# Patient Record
Sex: Female | Born: 1950
Health system: Southern US, Community
[De-identification: ages and names within clinical notes are randomized; demographics above are authoritative.]

## PROBLEM LIST (undated history)

## (undated) DIAGNOSIS — D219 Benign neoplasm of connective and other soft tissue, unspecified: Secondary | ICD-10-CM

## (undated) DIAGNOSIS — E785 Hyperlipidemia, unspecified: Secondary | ICD-10-CM

## (undated) DIAGNOSIS — E079 Disorder of thyroid, unspecified: Secondary | ICD-10-CM

## (undated) DIAGNOSIS — K219 Gastro-esophageal reflux disease without esophagitis: Secondary | ICD-10-CM

## (undated) DIAGNOSIS — J302 Other seasonal allergic rhinitis: Secondary | ICD-10-CM

## (undated) DIAGNOSIS — K589 Irritable bowel syndrome without diarrhea: Secondary | ICD-10-CM

## (undated) DIAGNOSIS — M810 Age-related osteoporosis without current pathological fracture: Secondary | ICD-10-CM

## (undated) DIAGNOSIS — M8580 Other specified disorders of bone density and structure, unspecified site: Secondary | ICD-10-CM

## (undated) DIAGNOSIS — T7840XA Allergy, unspecified, initial encounter: Secondary | ICD-10-CM

## (undated) HISTORY — DX: Other seasonal allergic rhinitis: J30.2

## (undated) HISTORY — DX: Age-related osteoporosis without current pathological fracture: M81.0

## (undated) HISTORY — DX: Allergy, unspecified, initial encounter: T78.40XA

## (undated) HISTORY — DX: Gastro-esophageal reflux disease without esophagitis: K21.9

## (undated) HISTORY — DX: Irritable bowel syndrome, unspecified: K58.9

## (undated) HISTORY — DX: Disorder of thyroid, unspecified: E07.9

## (undated) HISTORY — PX: TRIGGER FINGER RELEASE: SHX641

## (undated) HISTORY — DX: Hyperlipidemia, unspecified: E78.5

## (undated) HISTORY — DX: Other specified disorders of bone density and structure, unspecified site: M85.80

## (undated) HISTORY — PX: COLONOSCOPY: SHX174

## (undated) HISTORY — PX: ABDOMINAL HYSTERECTOMY: SHX81

## (undated) HISTORY — DX: Benign neoplasm of connective and other soft tissue, unspecified: D21.9

---

## 1998-02-28 ENCOUNTER — Other Ambulatory Visit: Admission: RE | Admit: 1998-02-28 | Discharge: 1998-02-28 | Payer: Self-pay | Admitting: Obstetrics and Gynecology

## 2002-06-11 ENCOUNTER — Other Ambulatory Visit: Admission: RE | Admit: 2002-06-11 | Discharge: 2002-06-11 | Payer: Self-pay | Admitting: Obstetrics and Gynecology

## 2003-06-25 ENCOUNTER — Other Ambulatory Visit: Admission: RE | Admit: 2003-06-25 | Discharge: 2003-06-25 | Payer: Self-pay | Admitting: Obstetrics and Gynecology

## 2004-07-23 ENCOUNTER — Other Ambulatory Visit: Admission: RE | Admit: 2004-07-23 | Discharge: 2004-07-23 | Payer: Self-pay | Admitting: Obstetrics and Gynecology

## 2005-07-19 ENCOUNTER — Ambulatory Visit: Payer: Self-pay | Admitting: Gastroenterology

## 2005-08-04 ENCOUNTER — Other Ambulatory Visit: Admission: RE | Admit: 2005-08-04 | Discharge: 2005-08-04 | Payer: Self-pay | Admitting: Obstetrics and Gynecology

## 2006-08-12 ENCOUNTER — Other Ambulatory Visit: Admission: RE | Admit: 2006-08-12 | Discharge: 2006-08-12 | Payer: Self-pay | Admitting: Obstetrics and Gynecology

## 2006-12-21 ENCOUNTER — Ambulatory Visit: Payer: Self-pay | Admitting: Gastroenterology

## 2007-05-30 ENCOUNTER — Encounter: Admission: RE | Admit: 2007-05-30 | Discharge: 2007-05-30 | Payer: Self-pay | Admitting: Endocrinology

## 2007-06-20 ENCOUNTER — Encounter: Admission: RE | Admit: 2007-06-20 | Discharge: 2007-06-20 | Payer: Self-pay | Admitting: Endocrinology

## 2007-09-05 ENCOUNTER — Other Ambulatory Visit: Admission: RE | Admit: 2007-09-05 | Discharge: 2007-09-05 | Payer: Self-pay | Admitting: Obstetrics and Gynecology

## 2008-04-08 ENCOUNTER — Telehealth: Payer: Self-pay | Admitting: Gastroenterology

## 2008-06-07 ENCOUNTER — Encounter: Payer: Self-pay | Admitting: Gastroenterology

## 2008-06-10 ENCOUNTER — Ambulatory Visit: Payer: Self-pay | Admitting: Gastroenterology

## 2008-06-10 DIAGNOSIS — K219 Gastro-esophageal reflux disease without esophagitis: Secondary | ICD-10-CM | POA: Insufficient documentation

## 2008-09-17 ENCOUNTER — Other Ambulatory Visit: Admission: RE | Admit: 2008-09-17 | Discharge: 2008-09-17 | Payer: Self-pay | Admitting: Obstetrics and Gynecology

## 2008-09-17 ENCOUNTER — Ambulatory Visit: Payer: Self-pay | Admitting: Obstetrics and Gynecology

## 2008-09-17 ENCOUNTER — Encounter: Payer: Self-pay | Admitting: Obstetrics and Gynecology

## 2008-10-14 ENCOUNTER — Ambulatory Visit: Payer: Self-pay | Admitting: Obstetrics and Gynecology

## 2009-03-06 ENCOUNTER — Telehealth: Payer: Self-pay | Admitting: Gastroenterology

## 2009-10-29 ENCOUNTER — Other Ambulatory Visit: Admission: RE | Admit: 2009-10-29 | Discharge: 2009-10-29 | Payer: Self-pay | Admitting: Obstetrics and Gynecology

## 2009-10-29 ENCOUNTER — Ambulatory Visit: Payer: Self-pay | Admitting: Obstetrics and Gynecology

## 2009-11-06 ENCOUNTER — Ambulatory Visit: Payer: Self-pay | Admitting: Obstetrics and Gynecology

## 2010-11-05 ENCOUNTER — Encounter: Payer: Self-pay | Admitting: Obstetrics and Gynecology

## 2010-11-25 ENCOUNTER — Other Ambulatory Visit: Payer: Self-pay | Admitting: Obstetrics and Gynecology

## 2010-11-25 ENCOUNTER — Other Ambulatory Visit (HOSPITAL_COMMUNITY)
Admission: RE | Admit: 2010-11-25 | Discharge: 2010-11-25 | Disposition: A | Discharge status: Home / Self Care | Payer: BC Managed Care – PPO | Source: Ambulatory Visit | Attending: Obstetrics and Gynecology | Admitting: Obstetrics and Gynecology

## 2010-11-25 ENCOUNTER — Encounter (INDEPENDENT_AMBULATORY_CARE_PROVIDER_SITE_OTHER): Payer: BC Managed Care – PPO | Admitting: Obstetrics and Gynecology

## 2010-11-25 DIAGNOSIS — Z833 Family history of diabetes mellitus: Secondary | ICD-10-CM

## 2010-11-25 DIAGNOSIS — Z01419 Encounter for gynecological examination (general) (routine) without abnormal findings: Secondary | ICD-10-CM

## 2010-11-25 DIAGNOSIS — Z124 Encounter for screening for malignant neoplasm of cervix: Secondary | ICD-10-CM | POA: Insufficient documentation

## 2010-12-02 ENCOUNTER — Encounter (INDEPENDENT_AMBULATORY_CARE_PROVIDER_SITE_OTHER): Payer: BC Managed Care – PPO

## 2010-12-02 DIAGNOSIS — M899 Disorder of bone, unspecified: Secondary | ICD-10-CM

## 2010-12-22 NOTE — Assessment & Plan Note (Signed)
[facility]                         GASTROENTEROLOGY OFFICE NOTE   NAME:[name], Patricia Carey                        MRN:          [id number]  DATE:12/21/2006                            DOB:          May 23, 1951    The patient comes in and says she has been having some burning in the  left side of the neck that every now and then radiates to the shoulder  and has been going on for several weeks. There is no rotator cuff  problems. Thought maybe it had to do with reflux. She said she thought  it might be related to GERD. I told her it would be awfully unusual in  my experience to be related to GERD. She had been taking 20 mg AcipHex  daily, Reglan at bedtime and she takes Estradiol. She also has known  irritable bowel syndrome and problems with intestinal gas.   She had an upper endoscopy in 2001 and a colonoscopic examination at the  same time and it was totally normal, I suggested a 5 year followup. The  upper endoscopy revealed only some mild distal esophagitis, nothing else  of significance.   PHYSICAL EXAMINATION:  She weighed 177, blood pressure 120/68, pulse 64  and regular. Neck, heart and extremities are all unremarkable.   RECOMMENDATIONS:  Take AcipHex as described and the Reglan. I refilled  these and I told her that she needed to have a colonoscopic examination  some time in the future. I asked her to followup with one of my partners  as needed for her followup care.     Ulyess Mort, MD  Electronically Signed    SML/MedQ  DD: 12/21/2006  DT: 12/21/2006  Job #: [id number]

## 2011-03-03 ENCOUNTER — Other Ambulatory Visit: Payer: Self-pay | Admitting: Obstetrics and Gynecology

## 2011-03-09 ENCOUNTER — Encounter: Payer: Self-pay | Admitting: Obstetrics and Gynecology

## 2011-03-09 ENCOUNTER — Telehealth: Payer: Self-pay

## 2011-03-09 NOTE — Telephone Encounter (Signed)
How often has she been taking her vitamin D 50,000 IUs?

## 2011-03-09 NOTE — Telephone Encounter (Signed)
PT. STATES RECENT VIT. D LEVEL WITH PCP WAS 27.1. CONTINUE VIT.D 50,000 I.U. RX? IF SO WE NEED TO SEND RX TO [facility].

## 2011-03-10 MED ORDER — VITAMIN D3 1.25 MG (50000 UT) PO CAPS: 1.0000 | ORAL_CAPSULE | ORAL | Status: DC

## 2011-03-10 NOTE — Telephone Encounter (Signed)
PT TAKES VIT. D 50,000IU'S EVERY OTHER WEEK. NO OTC VIT.D. I SET UP MEDS & ORDERS FOR YOU TO ADD DIAGNOSIS,SIGN & RX WILL ROUTE TO [facility] ONCE YOU CLOSE ENCOUNTER.

## 2011-03-11 ENCOUNTER — Telehealth: Payer: Self-pay

## 2011-03-11 MED ORDER — ERGOCALCIFEROL 1.25 MG (50000 UT) PO CAPS: ORAL_CAPSULE | ORAL | Status: DC

## 2011-03-11 NOTE — Telephone Encounter (Signed)
PT NOTIFIED BY WORK VMAIL CORRECT VIT. 50,000 IU'S RX WAS RESENT TO [facility].

## 2011-05-28 ENCOUNTER — Other Ambulatory Visit: Payer: Self-pay | Admitting: Gastroenterology

## 2011-06-28 ENCOUNTER — Other Ambulatory Visit: Payer: Self-pay | Admitting: Gastroenterology

## 2011-06-28 MED ORDER — OMEPRAZOLE-SODIUM BICARBONATE 40-1100 MG PO CAPS: 1.0000 | ORAL_CAPSULE | Freq: Every day | ORAL | Status: DC

## 2011-06-28 NOTE — Telephone Encounter (Signed)
Pt called and rov scheduled zegerid sent to pharmacy to last until rov

## 2011-07-12 ENCOUNTER — Ambulatory Visit: Payer: BC Managed Care – PPO | Admitting: Gastroenterology

## 2011-08-10 HISTORY — PX: COLONOSCOPY: SHX174

## 2011-08-13 ENCOUNTER — Ambulatory Visit (INDEPENDENT_AMBULATORY_CARE_PROVIDER_SITE_OTHER): Payer: BC Managed Care – PPO | Admitting: Gastroenterology

## 2011-08-13 ENCOUNTER — Encounter: Payer: Self-pay | Admitting: Gastroenterology

## 2011-08-13 VITALS — BP 128/74 | HR 64 | Ht 63.0 in | Wt 181.0 lb

## 2011-08-13 DIAGNOSIS — Z1211 Encounter for screening for malignant neoplasm of colon: Secondary | ICD-10-CM

## 2011-08-13 MED ORDER — PEG-KCL-NACL-NASULF-NA ASC-C 100 G PO SOLR: 1.0000 | ORAL | Status: DC

## 2011-08-13 MED ORDER — OMEPRAZOLE-SODIUM BICARBONATE 40-1100 MG PO CAPS: 1.0000 | ORAL_CAPSULE | Freq: Every day | ORAL | Status: DC

## 2011-08-13 NOTE — Patient Instructions (Addendum)
New script for Zegeride called in. You will be set up for a colonoscopy for colon cancer screening, routine risk. A copy of this information will be made available to Dr. Azucena Cecil.   Pt Movi Prep prescription was sent to mail order pharmacy in error was resent to [facility] aid. I spoke with [facility] at [facility] and cancelled the Movi prep with them. Confirmation of the cancellation was given by Erie Noe.

## 2011-08-13 NOTE — Progress Notes (Signed)
Review of pertinent gastrointestinal problems: 1. GERD: she had upper endoscopy Dr. Sam LeBauerJuly 2001 this showed mild acute and chronic reflux esophagitis mild antritis and slight inflammation and pyloric channel mild duodenitis.  PPI has worked very well.   HPI: This is a  very pleasant 61 year old woman whom I last saw about 3-1/2 years ago.  Whom i last saw 3 years.  Takes zegeride, one pill a day, usually before bf.  This helps very well.  Has occasional dyspepsia and will take a tagamet or pepcid or some baking soda.  No dysphagia.  Overall weight is up a bit after the holiday's.    She is at routine risk for colon cancer, her last colonoscopy for colon cancer screening was in 2001.  Review of systems: Pertinent positive and negative review of systems were noted in the above HPI section. Complete review of systems was performed and was otherwise normal.    Past Medical History  Diagnosis Date  . GERD (gastroesophageal reflux disease)   . IBS (irritable bowel syndrome)   . Thyroid disease   . Hyperlipemia     Past Surgical History  Procedure Date  . Trigger finger release     Current Outpatient Prescriptions  Medication Sig Dispense Refill  . AMBULATORY NON FORMULARY MEDICATION Allergy Injection Once a week       . CRESTOR 20 MG tablet Takes one every other day       . ergocalciferol (VITAMIN D2) 50000 UNITS capsule TAKE ONE PILL BY MOUTH EVERY OTHER WEEK   6 capsule  3  . estradiol (ESTRACE) 1 MG tablet Take 1 mg by mouth as directed.       Marland Kitchen levothyroxine (SYNTHROID, LEVOTHROID) 50 MCG tablet Take 50 mcg by mouth daily.        Marland Kitchen omeprazole-sodium bicarbonate (ZEGERID) 40-1100 MG per capsule Take 1 capsule by mouth daily before breakfast.        . Triamcinolone Acetonide (NASACORT AQ NA) Place into the nose as directed.          Allergies as of 08/13/2011 - Review Complete 08/13/2011  Allergen Reaction Noted  . Aspirin  06/10/2008    Family History    Problem Relation Age of Onset  . Colon cancer Neg Hx     History   Social History  . Marital Status: Single    Spouse Name: N/A    Number of Children: N/A  . Years of Education: N/A   Occupational History  . Not on file.   Social History Main Topics  . Smoking status: Never Smoker   . Smokeless tobacco: Never Used  . Alcohol Use: No  . Drug Use: No  . Sexually Active: Not on file   Other Topics Concern  . Not on file   Social History Narrative  . No narrative on file       Physical Exam: BP 128/74  Pulse 64  Ht 5\' 3"  (1.6 m)  Wt 181 lb (82.101 kg)  BMI 32.06 kg/m2 Constitutional: generally well-appearing Psychiatric: alert and oriented x3 Eyes: extraocular movements intact Mouth: oral pharynx moist, no lesions Neck: supple no lymphadenopathy Cardiovascular: heart regular rate and rhythm Lungs: clear to auscultation bilaterally Abdomen: soft, nontender, nondistended, no obvious ascites, no peritoneal signs, normal bowel sounds Extremities: no lower extremity edema bilaterally Skin: no lesions on visible extremities    Assessment and plan: 61 y.o. female with  chronic GERD without alarm symptoms, well-controlled on proton pump inhibitor. Routine risk for  colon cancer  She will continue on proton pump inhibitor once daily, I have called her in a new prescription. She is at routine risk for colon cancer and we will arrange for colonoscopy to be done at her soonest convenience, her last was in 2001.

## 2011-08-18 ENCOUNTER — Ambulatory Visit (AMBULATORY_SURGERY_CENTER): Payer: BC Managed Care – PPO | Admitting: Gastroenterology

## 2011-08-18 ENCOUNTER — Encounter: Payer: Self-pay | Admitting: Gastroenterology

## 2011-08-18 VITALS — HR 71 | Temp 96.7°F | Resp 20 | Ht 63.0 in | Wt 181.0 lb

## 2011-08-18 DIAGNOSIS — Z1211 Encounter for screening for malignant neoplasm of colon: Secondary | ICD-10-CM

## 2011-08-18 MED ORDER — SODIUM CHLORIDE 0.9 % IV SOLN: 500.0000 mL | INTRAVENOUS | Status: DC

## 2011-08-18 NOTE — Op Note (Signed)
[facility] [address] [place], [place]  [address]  COLONOSCOPY PROCEDURE REPORT  PATIENT:  Patricia Carey, Patricia Carey  MR#:  [id number] BIRTHDATE:  [birth year], 60 yrs. old  GENDER:  female ENDOSCOPIST:  Rachael Fee, MD PROCEDURE DATE:  08/18/2011 PROCEDURE:  Colonoscopy 96[address] ASA CLASS:  Class II INDICATIONS:  Routine Risk Screening MEDICATIONS:   Fentanyl 75 mcg IV, These medications were titrated to patient response per physician's verbal order, Versed 8 mg IV  DESCRIPTION OF PROCEDURE:   After the risks benefits and alternatives of the procedure were thoroughly explained, informed consent was obtained.  Digital rectal exam was performed and revealed no rectal masses.   The LB [id number] endoscope was introduced through the anus and advanced to the cecum, which was identified by both the appendix and ileocecal valve, without limitations.  The quality of the prep was good..  The instrument was then slowly withdrawn as the colon was fully examined. <<PROCEDUREIMAGES>>  FINDINGS:  Internal Hemorrhoids were found.  This was otherwise a normal examination of the colon (see image1, image3, and image4). Retroflexed views in the rectum revealed no abnormalities. COMPLICATIONS:  None  ENDOSCOPIC IMPRESSION: 1) Internal hemorrhoids 2) Otherwise normal examination; no polyps or cancers  RECOMMENDATIONS: 1) You should continue to follow colorectal cancer screening guidelines for "routine risk" patients with a repeat colonoscopy in 10 years. There is no need for FOBT (stool) testing for at least 5 years.  REPEAT EXAM:  10 years  ______________________________ Rachael Fee, MD  n. eSIGNED:   Rachael Fee at 08/18/2011 11:00 AM  Cottie Banda, [phone]

## 2011-08-18 NOTE — Patient Instructions (Signed)
Resume all medications. D/C instructions reviewed with family. Information given on hemorrhoids and high fiber diet.

## 2011-08-18 NOTE — Progress Notes (Signed)
Patient did not experience any of the following events: a burn prior to discharge; a fall within the facility; wrong site/side/patient/procedure/implant event; or a hospital transfer or hospital admission upon discharge from the facility. (G8907) Patient did not have preoperative order for IV antibiotic SSI prophylaxis. (G8918)  

## 2011-08-19 ENCOUNTER — Telehealth: Payer: Self-pay | Admitting: *Deleted

## 2011-08-19 NOTE — Telephone Encounter (Signed)

## 2011-11-30 ENCOUNTER — Other Ambulatory Visit: Payer: Self-pay | Admitting: Obstetrics and Gynecology

## 2012-02-17 ENCOUNTER — Telehealth: Payer: Self-pay | Admitting: Obstetrics and Gynecology

## 2012-02-17 ENCOUNTER — Other Ambulatory Visit: Payer: Self-pay | Admitting: Obstetrics and Gynecology

## 2012-02-17 NOTE — Telephone Encounter (Signed)
Left message for patient that I refilled her Rx one time but she is overdue for CE and needs to call and schedule RGCE.

## 2012-02-28 ENCOUNTER — Other Ambulatory Visit: Payer: Self-pay | Admitting: Obstetrics and Gynecology

## 2012-02-28 NOTE — Telephone Encounter (Signed)
Has RGCE scheduled 03/21/12.

## 2012-03-06 ENCOUNTER — Encounter: Payer: Self-pay | Admitting: Obstetrics and Gynecology

## 2012-03-10 ENCOUNTER — Encounter: Payer: Self-pay | Admitting: Gynecology

## 2012-03-10 DIAGNOSIS — T7840XA Allergy, unspecified, initial encounter: Secondary | ICD-10-CM | POA: Insufficient documentation

## 2012-03-10 DIAGNOSIS — K589 Irritable bowel syndrome without diarrhea: Secondary | ICD-10-CM | POA: Insufficient documentation

## 2012-03-10 DIAGNOSIS — E079 Disorder of thyroid, unspecified: Secondary | ICD-10-CM | POA: Insufficient documentation

## 2012-03-10 DIAGNOSIS — M8580 Other specified disorders of bone density and structure, unspecified site: Secondary | ICD-10-CM | POA: Insufficient documentation

## 2012-03-10 DIAGNOSIS — D219 Benign neoplasm of connective and other soft tissue, unspecified: Secondary | ICD-10-CM | POA: Insufficient documentation

## 2012-03-10 DIAGNOSIS — K219 Gastro-esophageal reflux disease without esophagitis: Secondary | ICD-10-CM | POA: Insufficient documentation

## 2012-03-10 DIAGNOSIS — E785 Hyperlipidemia, unspecified: Secondary | ICD-10-CM | POA: Insufficient documentation

## 2012-03-21 ENCOUNTER — Encounter: Payer: BC Managed Care – PPO | Admitting: Obstetrics and Gynecology

## 2012-04-03 ENCOUNTER — Encounter: Payer: Self-pay | Admitting: Obstetrics and Gynecology

## 2012-04-03 ENCOUNTER — Ambulatory Visit (INDEPENDENT_AMBULATORY_CARE_PROVIDER_SITE_OTHER): Payer: BC Managed Care – PPO | Admitting: Obstetrics and Gynecology

## 2012-04-03 VITALS — BP 126/74 | Ht 64.0 in | Wt 186.0 lb

## 2012-04-03 DIAGNOSIS — Z01419 Encounter for gynecological examination (general) (routine) without abnormal findings: Secondary | ICD-10-CM

## 2012-04-03 MED ORDER — ESTRADIOL 0.5 MG PO TABS: 0.5000 mg | ORAL_TABLET | Freq: Every day | ORAL | Status: DC

## 2012-04-03 NOTE — Patient Instructions (Signed)
Continue yearly mammograms. Bone density in 2014. 

## 2012-04-03 NOTE — Progress Notes (Signed)
The patient came to see me today for her annual GYN exam. We have reduced her estradiol from 1 mg to half a milligram. She has done well on  a reduced dose. She is not having hot flashes. She is currently not sexually active. She is up-to-date on mammograms. Her last bone density was April, 2012. She has stable osteopenia without an elevated FRAX risk. She has had no fractures. She is having no vaginal bleeding. She is having no pelvic pain. She had a TAH in 1988 for fibroids and adenomyosis. She has always had normal Pap smears. Her last Pap smear was 2012. Her PCP does her lab work and treats her for hypothyroidism.  HEENT: Within normal limits.Kennon Portela present. Neck: No masses. Supraclavicular lymph nodes: Not enlarged. Breasts: Examined in both sitting and lying position. Symmetrical without skin changes or masses. Abdomen: Soft no masses guarding or rebound. No hernias. Pelvic: External within normal limits. BUS within normal limits. Vaginal examination shows poor  estrogen effect, no cystocele enterocele or rectocele. Cervix and uterus absent. Adnexa within normal limits. Rectovaginal confirmatory. Extremities within normal limits.  Assessment: #1. Menopausal symptoms #2. Osteopenia  Plan: Continue estradiol half a milligram daily. Continue bone densities. Continue yearly mammograms. Discussed either stopping estradiol or taking it less frequently. She will decide.The new Pap smear guidelines were discussed with the patient. No pap done.

## 2012-04-04 LAB — URINALYSIS W MICROSCOPIC + REFLEX CULTURE
Bacteria, UA: NONE SEEN
Casts: NONE SEEN
Crystals: NONE SEEN
Glucose, UA: NEGATIVE mg/dL
Leukocytes, UA: NEGATIVE
Protein, ur: NEGATIVE mg/dL
Specific Gravity, Urine: 1.018 (ref 1.005–1.030)
Urobilinogen, UA: 1 mg/dL (ref 0.0–1.0)
pH: 6 (ref 5.0–8.0)

## 2012-04-07 ENCOUNTER — Encounter: Payer: Self-pay | Admitting: Obstetrics and Gynecology

## 2012-05-11 ENCOUNTER — Other Ambulatory Visit: Payer: Self-pay | Admitting: Obstetrics and Gynecology

## 2012-08-25 ENCOUNTER — Other Ambulatory Visit: Payer: Self-pay | Admitting: Gastroenterology

## 2012-09-29 ENCOUNTER — Telehealth: Payer: Self-pay | Admitting: Gastroenterology

## 2012-09-29 MED ORDER — OMEPRAZOLE-SODIUM BICARBONATE 40-1100 MG PO CAPS: 1.0000 | ORAL_CAPSULE | Freq: Every day | ORAL | Status: DC

## 2012-09-29 NOTE — Telephone Encounter (Signed)
Refill sent per pt request.  

## 2012-10-13 ENCOUNTER — Ambulatory Visit: Payer: BC Managed Care – PPO | Admitting: Gastroenterology

## 2012-10-27 ENCOUNTER — Ambulatory Visit (INDEPENDENT_AMBULATORY_CARE_PROVIDER_SITE_OTHER): Payer: BC Managed Care – PPO | Admitting: Gastroenterology

## 2012-10-27 ENCOUNTER — Encounter: Payer: Self-pay | Admitting: Gastroenterology

## 2012-10-27 VITALS — BP 122/80 | HR 80 | Ht 63.25 in | Wt 185.0 lb

## 2012-10-27 DIAGNOSIS — K219 Gastro-esophageal reflux disease without esophagitis: Secondary | ICD-10-CM

## 2012-10-27 NOTE — Progress Notes (Signed)
Review of pertinent gastrointestinal problems:  1. GERD: she had upper endoscopy Dr. Sam LeBauerJuly 2001 this showed mild acute and chronic reflux esophagitis mild antritis and slight inflammation and pyloric channel mild duodenitis. PPI has worked very well. 2. Routine risk for colon cancer; colonoscopy Christella Hartigan 08/2011 found no polyp, + hemorrhoids. Recall exam at 10 years for screening.  HPI: This is a  very pleasant 62 year old woman that I saw about a year ago.   zegeride works well.  She tried cutting back even just a few hours and then has indigestion, pyrosis.    She has no dypshagia, no weight loss, no overt gi bleeding.  wieght up a bit.  Will take an h2 blocker at bedtime.   Past Medical History  Diagnosis Date  . GERD (gastroesophageal reflux disease)   . IBS (irritable bowel syndrome)   . Hyperlipemia   . Allergy   . Fibroid   . Thyroid disease   . Osteopenia     Past Surgical History  Procedure Laterality Date  . Trigger finger release    . Colonoscopy      2001  . Abdominal hysterectomy      TAH    Current Outpatient Prescriptions  Medication Sig Dispense Refill  . AMBULATORY NON FORMULARY MEDICATION Allergy Injection Once a week       . CRESTOR 20 MG tablet Takes one every other day       . estradiol (ESTRACE) 0.5 MG tablet Take 1 tablet (0.5 mg total) by mouth daily.  90 tablet  3  . levothyroxine (SYNTHROID, LEVOTHROID) 50 MCG tablet Take 50 mcg by mouth daily.        Marland Kitchen omeprazole-sodium bicarbonate (ZEGERID) 40-1100 MG per capsule Take 1 capsule by mouth daily before breakfast.  90 capsule  3  . PATADAY 0.2 % SOLN       . Triamcinolone Acetonide (NASACORT AQ NA) Place into the nose as directed.        . Vitamin D, Ergocalciferol, (DRISDOL) 50000 UNITS CAPS Take 1 capsule (50,000 Units total) by mouth every 14 (fourteen) days.  6 capsule  4   No current facility-administered medications for this visit.    Allergies as of 10/27/2012 - Review Complete  10/27/2012  Allergen Reaction Noted  . Aspirin  06/10/2008    Family History  Problem Relation Age of Onset  . Colon cancer Neg Hx   . Throat cancer Sister 49    DECEASED  . Cancer Sister     Throat cancer  . Heart disease Mother   . Breast cancer Sister     Age 50's  . Heart disease Sister     History   Social History  . Marital Status: Single    Spouse Name: N/A    Number of Children: N/A  . Years of Education: N/A   Occupational History  . Not on file.   Social History Main Topics  . Smoking status: Never Smoker   . Smokeless tobacco: Never Used  . Alcohol Use: Yes     Comment: Rare  . Drug Use: No  . Sexually Active: No   Other Topics Concern  . Not on file   Social History Narrative  . No narrative on file      Physical Exam: BP 122/80  Pulse 80  Ht 5' 3.25" (1.607 m)  Wt 185 lb (83.915 kg)  BMI 32.49 kg/m2 Constitutional: generally well-appearing Psychiatric: alert and oriented x3 Abdomen: soft, nontender, nondistended, no obvious  ascites, no peritoneal signs, normal bowel sounds     Assessment and plan: 62 y.o. female with chronic GERD, no alarm symptoms  She will continue taking proton pump inhibitor once daily and H2 blocker on an as-needed basis. She will try Gas-X for some mild bloating which she feels with certain foods. She does not need to return for medication refill for her proton pump inhibitor for 2 years. She knows to call sooner she has other issues.

## 2012-10-27 NOTE — Patient Instructions (Addendum)
Continue zegeride once daily.  Tagamet as needed. No need for return appointment if all is well for 2 years. Try one gas ex pill with any bloating meal.

## 2013-04-07 ENCOUNTER — Other Ambulatory Visit: Payer: Self-pay | Admitting: Obstetrics and Gynecology

## 2013-04-10 NOTE — Telephone Encounter (Signed)
Pt will be called to schedule her annual exam.  

## 2013-05-22 ENCOUNTER — Other Ambulatory Visit: Payer: Self-pay | Admitting: Obstetrics and Gynecology

## 2013-07-06 ENCOUNTER — Other Ambulatory Visit: Payer: Self-pay | Admitting: Gynecology

## 2013-09-04 ENCOUNTER — Other Ambulatory Visit: Payer: Self-pay | Admitting: Gastroenterology

## 2014-03-21 ENCOUNTER — Encounter: Payer: Self-pay | Admitting: Gastroenterology

## 2014-05-09 ENCOUNTER — Other Ambulatory Visit: Payer: Self-pay

## 2014-05-09 ENCOUNTER — Other Ambulatory Visit: Payer: Self-pay | Admitting: Gastroenterology

## 2014-05-09 MED ORDER — OMEPRAZOLE-SODIUM BICARBONATE 40-1100 MG PO CAPS: 1.0000 | ORAL_CAPSULE | Freq: Every day | ORAL | Status: DC

## 2014-05-10 ENCOUNTER — Other Ambulatory Visit: Payer: Self-pay

## 2014-05-10 MED ORDER — OMEPRAZOLE-SODIUM BICARBONATE 40-1100 MG PO CAPS: 1.0000 | ORAL_CAPSULE | Freq: Every day | ORAL | Status: DC

## 2014-05-16 ENCOUNTER — Telehealth: Payer: Self-pay | Admitting: Gastroenterology

## 2015-06-07 ENCOUNTER — Other Ambulatory Visit: Payer: Self-pay | Admitting: Gastroenterology

## 2015-06-24 ENCOUNTER — Other Ambulatory Visit: Payer: Self-pay

## 2015-06-30 ENCOUNTER — Other Ambulatory Visit: Payer: Self-pay

## 2015-06-30 ENCOUNTER — Other Ambulatory Visit: Payer: Self-pay | Admitting: Gastroenterology

## 2015-06-30 MED ORDER — OMEPRAZOLE-SODIUM BICARBONATE 40-1100 MG PO CAPS: 1.0000 | ORAL_CAPSULE | Freq: Every day | ORAL | Status: DC

## 2015-06-30 NOTE — Telephone Encounter (Signed)
Script sent to pharmacy.

## 2015-08-18 ENCOUNTER — Ambulatory Visit: Payer: Self-pay | Admitting: Gastroenterology

## 2015-09-26 ENCOUNTER — Other Ambulatory Visit: Payer: Self-pay

## 2015-09-30 ENCOUNTER — Telehealth: Payer: Self-pay | Admitting: Gastroenterology

## 2015-09-30 NOTE — Telephone Encounter (Signed)
Pt has been notified that she must keep the appt as scheduled for further refills

## 2015-09-30 NOTE — Telephone Encounter (Signed)
Per pt she ran out of her omeprazole-sodium bicarbonate (ZEGERID) 40-1100 MG capsule a lot sooner. She says her pcp recommended she take it twice a day, because her reflux had increased. She says it has helped. Pt has questions and would like to speak to someone.

## 2015-10-13 ENCOUNTER — Ambulatory Visit (INDEPENDENT_AMBULATORY_CARE_PROVIDER_SITE_OTHER): Payer: Self-pay | Admitting: Gastroenterology

## 2015-10-13 ENCOUNTER — Encounter: Payer: Self-pay | Admitting: Gastroenterology

## 2015-10-13 VITALS — BP 110/70 | HR 72 | Ht 63.0 in | Wt 174.0 lb

## 2015-10-13 DIAGNOSIS — K219 Gastro-esophageal reflux disease without esophagitis: Secondary | ICD-10-CM | POA: Diagnosis not present

## 2015-10-13 NOTE — Progress Notes (Signed)
Review of pertinent gastrointestinal problems:  1. GERD: she had upper endoscopy Dr. Kathrynn Ducking 2001 this showed mild acute and chronic reflux esophagitis mild antritis and slight inflammation and pyloric channel mild duodenitis. PPI has worked very well. 2. Routine risk for colon cancer; colonoscopy Ardis Hughs 08/2011 found no polyp, + hemorrhoids. Recall exam at 10 years for screening.   HPI: This is a   Very pleasant 65 year old woman whom I last saw about 3 years ago  Chief complaint is chronic GERD  Recently went to allergy testing, due to sneezing in AM.  The MD said GERD may be causing her AM sneezing.    She changed her omeprazole to twice daily dosing. Noticed an improvement.  AM omeprazole 40 in AM, tagamet in HS  No dysphagia.  OVerall stable weight   Past Medical History  Diagnosis Date  . GERD (gastroesophageal reflux disease)   . IBS (irritable bowel syndrome)   . Hyperlipemia   . Allergy   . Fibroid   . Thyroid disease   . Osteopenia     Past Surgical History  Procedure Laterality Date  . Trigger finger release    . Colonoscopy      2001  . Abdominal hysterectomy      TAH    Current Outpatient Prescriptions  Medication Sig Dispense Refill  . AMBULATORY NON FORMULARY MEDICATION Allergy Injection Once a week     . CRESTOR 20 MG tablet Takes one every other day     . levothyroxine (SYNTHROID, LEVOTHROID) 50 MCG tablet Take 50 mcg by mouth daily.      Marland Kitchen omeprazole-sodium bicarbonate (ZEGERID) 40-1100 MG capsule Take 1 capsule by mouth daily before breakfast. 90 capsule 1  . PATADAY 0.2 % SOLN     . Triamcinolone Acetonide (NASACORT AQ NA) Place into the nose as directed.      . Vitamin D, Ergocalciferol, (DRISDOL) 50000 UNITS CAPS Take 1 capsule (50,000 Units total) by mouth every 14 (fourteen) days. 6 capsule 4   No current facility-administered medications for this visit.    Allergies as of 10/13/2015 - Review Complete 10/13/2015  Allergen  Reaction Noted  . Aspirin  06/10/2008    Family History  Problem Relation Age of Onset  . Colon cancer Neg Hx   . Throat cancer Sister 61    DECEASED  . Cancer Sister     Throat cancer  . Heart disease Mother   . Breast cancer Sister     Age 64's  . Heart disease Sister     Social History   Social History  . Marital Status: Single    Spouse Name: N/A  . Number of Children: N/A  . Years of Education: N/A   Occupational History  . Not on file.   Social History Main Topics  . Smoking status: Never Smoker   . Smokeless tobacco: Never Used  . Alcohol Use: Yes     Comment: Rare  . Drug Use: No  . Sexual Activity: No   Other Topics Concern  . Not on file   Social History Narrative     Physical Exam: BP 110/70 mmHg  Pulse 72  Ht 5\' 3"  (1.6 m)  Wt 174 lb (78.926 kg)  BMI 30.83 kg/m2 Constitutional: generally well-appearing Psychiatric: alert and oriented x3 Abdomen: soft, nontender, nondistended, no obvious ascites, no peritoneal signs, normal bowel sounds   Assessment and plan: 65 y.o. female with chronic GERD without alarm symptoms, well controlled symptoms on PPI, H2 blocker  She will continue morning proton pump inhibitor and bedtime H2 blocker. Her GERD symptoms are well controlled on this regimen. She has no alarm symptoms at all. I'm happy to refill her prescription antiacid medicines now and again in one year from now. If she still requires prescription strength antiacid medicines in 2 years I would like to see her again in the office   Owens Loffler, MD [facility] 10/13/2015, 3:47 PM

## 2015-10-13 NOTE — Patient Instructions (Signed)
Omeprazole 40mg  every AM, will refill in 2 months when needed and again in 1 year. Take ranitidine 150mg  pill, bedtime every night. Return in 2 years if you still need prescription strength medicines.

## 2015-12-15 ENCOUNTER — Other Ambulatory Visit: Payer: Self-pay | Admitting: Gastroenterology

## 2015-12-31 ENCOUNTER — Other Ambulatory Visit: Payer: Self-pay

## 2015-12-31 ENCOUNTER — Telehealth: Payer: Self-pay | Admitting: Gastroenterology

## 2015-12-31 MED ORDER — OMEPRAZOLE-SODIUM BICARBONATE 40-1100 MG PO CAPS: ORAL_CAPSULE | ORAL | Status: DC

## 2015-12-31 NOTE — Telephone Encounter (Signed)
Pt rx has been sent to the pharmacy as requested, pt notified

## 2015-12-31 NOTE — Telephone Encounter (Signed)
Prescription sent and pt aware she can pick up one month at the local pharmacy

## 2016-03-17 ENCOUNTER — Other Ambulatory Visit: Payer: Self-pay | Admitting: Gastroenterology

## 2017-03-09 ENCOUNTER — Other Ambulatory Visit: Payer: Self-pay | Admitting: Gastroenterology

## 2017-09-09 DIAGNOSIS — Z1231 Encounter for screening mammogram for malignant neoplasm of breast: Secondary | ICD-10-CM | POA: Diagnosis not present

## 2017-09-09 DIAGNOSIS — Z803 Family history of malignant neoplasm of breast: Secondary | ICD-10-CM | POA: Diagnosis not present

## 2017-10-27 DIAGNOSIS — Z23 Encounter for immunization: Secondary | ICD-10-CM | POA: Diagnosis not present

## 2017-10-27 DIAGNOSIS — R232 Flushing: Secondary | ICD-10-CM | POA: Diagnosis not present

## 2017-10-27 DIAGNOSIS — Z0000 Encounter for general adult medical examination without abnormal findings: Secondary | ICD-10-CM | POA: Diagnosis not present

## 2017-10-27 DIAGNOSIS — E039 Hypothyroidism, unspecified: Secondary | ICD-10-CM | POA: Diagnosis not present

## 2017-10-27 DIAGNOSIS — R7303 Prediabetes: Secondary | ICD-10-CM | POA: Diagnosis not present

## 2017-10-27 DIAGNOSIS — E559 Vitamin D deficiency, unspecified: Secondary | ICD-10-CM | POA: Diagnosis not present

## 2017-10-27 DIAGNOSIS — E782 Mixed hyperlipidemia: Secondary | ICD-10-CM | POA: Diagnosis not present

## 2018-02-21 DIAGNOSIS — M65312 Trigger thumb, left thumb: Secondary | ICD-10-CM | POA: Diagnosis not present

## 2018-02-21 DIAGNOSIS — M65342 Trigger finger, left ring finger: Secondary | ICD-10-CM | POA: Diagnosis not present

## 2018-02-21 DIAGNOSIS — M1812 Unilateral primary osteoarthritis of first carpometacarpal joint, left hand: Secondary | ICD-10-CM | POA: Diagnosis not present

## 2018-02-21 DIAGNOSIS — G5601 Carpal tunnel syndrome, right upper limb: Secondary | ICD-10-CM | POA: Diagnosis not present

## 2018-03-04 ENCOUNTER — Other Ambulatory Visit: Payer: Self-pay | Admitting: Gastroenterology

## 2018-03-07 ENCOUNTER — Other Ambulatory Visit: Payer: Self-pay | Admitting: Gastroenterology

## 2018-03-10 DIAGNOSIS — M1812 Unilateral primary osteoarthritis of first carpometacarpal joint, left hand: Secondary | ICD-10-CM | POA: Diagnosis not present

## 2018-03-10 DIAGNOSIS — M65312 Trigger thumb, left thumb: Secondary | ICD-10-CM | POA: Diagnosis not present

## 2018-03-20 ENCOUNTER — Telehealth: Payer: Self-pay | Admitting: Gastroenterology

## 2018-03-20 ENCOUNTER — Other Ambulatory Visit: Payer: Self-pay | Admitting: Gastroenterology

## 2018-03-20 MED ORDER — OMEPRAZOLE-SODIUM BICARBONATE 40-1100 MG PO CAPS: 1.0000 | ORAL_CAPSULE | Freq: Every day | ORAL | 2 refills | Status: DC

## 2018-03-20 NOTE — Telephone Encounter (Signed)
Pt just made an appt with Dr. Ardis Hughs for 05/16/18. She is out of Omeprazole and is requesting if she can have a rf until she sees him. She uses [facility] on [address]. She said that her insurance will only cover it if it is medically neccessary.

## 2018-03-20 NOTE — Telephone Encounter (Signed)
Sent enough refills of omeprazole to [facility] on [address] until office visit 10/19

## 2018-03-21 NOTE — Telephone Encounter (Signed)
Pt called stating that [facility] told her that her insurance will not cover omeprazole unless is medically necessary. However, she said that if prescription is sent to [facility] mail order for 90 day supply there should be no problem and it just needs a PA. PA phone is [phone].

## 2018-03-22 NOTE — Telephone Encounter (Signed)
I spoke with a PA rep through [facility]. PA was done for Zegerid and a response will be faxed to our office within 72 hours. Patient notified

## 2018-03-31 NOTE — Telephone Encounter (Signed)
Patient states she needs the generic for medication zegerid called into [facility] at [phone]. Patient states it will be covered if ordered in a 90 day supply.

## 2018-03-31 NOTE — Telephone Encounter (Signed)
This medication has been denied through [facility] and a letter was sent to patient. She can pay out of pocket or take otc Prilosec. She was asked to call the office for further questions

## 2018-04-25 DIAGNOSIS — K219 Gastro-esophageal reflux disease without esophagitis: Secondary | ICD-10-CM | POA: Diagnosis not present

## 2018-04-25 DIAGNOSIS — R7303 Prediabetes: Secondary | ICD-10-CM | POA: Diagnosis not present

## 2018-04-25 DIAGNOSIS — E782 Mixed hyperlipidemia: Secondary | ICD-10-CM | POA: Diagnosis not present

## 2018-04-25 DIAGNOSIS — E039 Hypothyroidism, unspecified: Secondary | ICD-10-CM | POA: Diagnosis not present

## 2018-05-16 ENCOUNTER — Encounter: Payer: Self-pay | Admitting: Gastroenterology

## 2018-05-16 ENCOUNTER — Ambulatory Visit: Payer: BLUE CROSS/BLUE SHIELD | Admitting: Gastroenterology

## 2018-05-16 VITALS — BP 134/68 | HR 64 | Ht 63.0 in | Wt 172.8 lb

## 2018-05-16 DIAGNOSIS — K219 Gastro-esophageal reflux disease without esophagitis: Secondary | ICD-10-CM

## 2018-05-16 MED ORDER — DEXLANSOPRAZOLE 60 MG PO CPDR
60.0000 mg | DELAYED_RELEASE_CAPSULE | Freq: Every day | ORAL | 3 refills | Status: DC
Start: 2018-05-16 — End: 2018-05-16

## 2018-05-16 MED ORDER — DEXLANSOPRAZOLE 60 MG PO CPDR: 60.0000 mg | DELAYED_RELEASE_CAPSULE | Freq: Every day | ORAL | 3 refills | Status: DC

## 2018-05-16 NOTE — Progress Notes (Signed)
Review of pertinent gastrointestinal problems:  1. GERD: she had upper endoscopy Dr. Kathrynn Ducking 2001 this showed mild acute and chronic reflux esophagitis mild antritis and slight inflammation and pyloric channel mild duodenitis. PPI has worked very well. 2. Routine risk for colon cancer; colonoscopy Ardis Hughs 08/2011 found no polyp, + hemorrhoids. Recall exam at 10 years for screening.   HPI: This is a very pleasant 67 yo woman with chronic GERD  Insurance wont pay for zegeride.  Omeprazole does not help. protonix does not help.  dexilant helps quite well, she got samples from her PCP.  60mg  pills.  I last saw her about 2-1/2 years ago.  At that time her GERD symptoms were under good control.  No alarm symptoms.  She was taking omeprazole 40 mg every morning and ranitidine at 150 mg at bedtime  She has no alarm symptoms (No dysphagia, no unexplained weight loss, no overt gi bleeding)   Chief complaint is chronic GERD  ROS: complete GI ROS as described in HPI, all other review negative.  Constitutional:  No unintentional weight loss   Past Medical History:  Diagnosis Date  . Allergy   . Fibroid   . GERD (gastroesophageal reflux disease)   . Hyperlipemia   . IBS (irritable bowel syndrome)   . Osteopenia   . Thyroid disease     Past Surgical History:  Procedure Laterality Date  . ABDOMINAL HYSTERECTOMY     TAH  . COLONOSCOPY     2001  . TRIGGER FINGER RELEASE      Current Outpatient Medications  Medication Sig Dispense Refill  . AMBULATORY NON FORMULARY MEDICATION Allergy Injection Once a week     . CRESTOR 20 MG tablet Takes one every other day     . levothyroxine (SYNTHROID, LEVOTHROID) 50 MCG tablet Take 50 mcg by mouth daily.      Marland Kitchen omeprazole-sodium bicarbonate (ZEGERID) 40-1100 MG capsule Take 1 capsule by mouth daily before breakfast. 30 capsule 2  . PATADAY 0.2 % SOLN     . Triamcinolone Acetonide (NASACORT AQ NA) Place into the nose as directed.      .  Vitamin D, Ergocalciferol, (DRISDOL) 50000 UNITS CAPS Take 1 capsule (50,000 Units total) by mouth every 14 (fourteen) days. 6 capsule 4   No current facility-administered medications for this visit.     Allergies as of 05/16/2018 - Review Complete 05/16/2018  Allergen Reaction Noted  . Aspirin  06/10/2008    Family History  Problem Relation Age of Onset  . Colon cancer Neg Hx   . Throat cancer Sister 49       DECEASED  . Cancer Sister        Throat cancer  . Heart disease Mother   . Breast cancer Sister        Age 63's  . Heart disease Sister     Social History   Socioeconomic History  . Marital status: Single    Spouse name: Not on file  . Number of children: Not on file  . Years of education: Not on file  . Highest education level: Not on file  Occupational History  . Not on file  Social Needs  . Financial resource strain: Not on file  . Food insecurity:    Worry: Not on file    Inability: Not on file  . Transportation needs:    Medical: Not on file    Non-medical: Not on file  Tobacco Use  . Smoking status: Never Smoker  .  Smokeless tobacco: Never Used  Substance and Sexual Activity  . Alcohol use: Yes    Comment: Rare  . Drug use: No  . Sexual activity: Never    Birth control/protection: Surgical  Lifestyle  . Physical activity:    Days per week: Not on file    Minutes per session: Not on file  . Stress: Not on file  Relationships  . Social connections:    Talks on phone: Not on file    Gets together: Not on file    Attends religious service: Not on file    Active member of club or organization: Not on file    Attends meetings of clubs or organizations: Not on file    Relationship status: Not on file  . Intimate partner violence:    Fear of current or ex partner: Not on file    Emotionally abused: Not on file    Physically abused: Not on file    Forced sexual activity: Not on file  Other Topics Concern  . Not on file  Social History Narrative   . Not on file     Physical Exam: BP 134/68   Pulse 64   Ht 5\' 3"  (1.6 m)   Wt 172 lb 12.8 oz (78.4 kg)   BMI 30.61 kg/m  Constitutional: generally well-appearing Psychiatric: alert and oriented x3 Abdomen: soft, nontender, nondistended, no obvious ascites, no peritoneal signs, normal bowel sounds No peripheral edema noted in lower extremities  Assessment and plan: 67 y.o. female with chronic GERD without alarm symptoms.  Dexilant seem to help quite well.  Hopefully wont' be too expensive with her insurance plan.  She will continue nightly famotidine 20mg  pills.  ROV in 2 years if she still needs prescription ppi, otherwise i'm happy to refill in 1 year.   Please see the "Patient Instructions" section for addition details about the plan.  Owens Loffler, MD [facility] 05/16/2018, 3:43 PM

## 2018-05-16 NOTE — Patient Instructions (Addendum)
New prescription for dexilant; 60mg  pills, one pill before breakfast every morning.  90 pills with 3 refills. Pepcid (famotidine) 20mg  pills is equilivant to zantac (ranitidine) 150mg  pills. Try gas-ex pills with bloating, gassiness. Consider food diary.  Thank you for entrusting me with your care and choosing [facility].  Dr Ardis Hughs

## 2018-05-19 ENCOUNTER — Telehealth: Payer: Self-pay | Admitting: Gastroenterology

## 2018-05-19 NOTE — Telephone Encounter (Signed)
The pt was advised that the pharmacy is the best place to ask that question.

## 2018-09-15 DIAGNOSIS — Z803 Family history of malignant neoplasm of breast: Secondary | ICD-10-CM | POA: Diagnosis not present

## 2018-09-15 DIAGNOSIS — Z1231 Encounter for screening mammogram for malignant neoplasm of breast: Secondary | ICD-10-CM | POA: Diagnosis not present

## 2018-11-02 DIAGNOSIS — R7303 Prediabetes: Secondary | ICD-10-CM | POA: Diagnosis not present

## 2018-11-02 DIAGNOSIS — K219 Gastro-esophageal reflux disease without esophagitis: Secondary | ICD-10-CM | POA: Diagnosis not present

## 2018-11-02 DIAGNOSIS — E039 Hypothyroidism, unspecified: Secondary | ICD-10-CM | POA: Diagnosis not present

## 2018-11-02 DIAGNOSIS — Z1211 Encounter for screening for malignant neoplasm of colon: Secondary | ICD-10-CM | POA: Diagnosis not present

## 2018-11-02 DIAGNOSIS — Z23 Encounter for immunization: Secondary | ICD-10-CM | POA: Diagnosis not present

## 2018-11-02 DIAGNOSIS — Z0000 Encounter for general adult medical examination without abnormal findings: Secondary | ICD-10-CM | POA: Diagnosis not present

## 2018-11-02 DIAGNOSIS — E782 Mixed hyperlipidemia: Secondary | ICD-10-CM | POA: Diagnosis not present

## 2018-11-02 DIAGNOSIS — E559 Vitamin D deficiency, unspecified: Secondary | ICD-10-CM | POA: Diagnosis not present

## 2018-12-11 ENCOUNTER — Other Ambulatory Visit: Payer: Self-pay

## 2018-12-11 ENCOUNTER — Ambulatory Visit (INDEPENDENT_AMBULATORY_CARE_PROVIDER_SITE_OTHER): Payer: BLUE CROSS/BLUE SHIELD | Admitting: Gastroenterology

## 2018-12-11 VITALS — Ht 63.0 in | Wt 172.0 lb

## 2018-12-11 DIAGNOSIS — K219 Gastro-esophageal reflux disease without esophagitis: Secondary | ICD-10-CM

## 2018-12-11 MED ORDER — OMEPRAZOLE 40 MG PO CPDR: 40.0000 mg | DELAYED_RELEASE_CAPSULE | Freq: Every day | ORAL | 3 refills | Status: DC

## 2018-12-11 MED ORDER — FAMOTIDINE 20 MG PO TABS: 20.0000 mg | ORAL_TABLET | Freq: Every day | ORAL | 3 refills | Status: AC

## 2018-12-11 NOTE — Patient Instructions (Addendum)
New script for omeprazole 40mg  pills to take one pill every morning 20-30 min before breakfast meal.  Disp 90 with 3 refills.  Famotidine 20mg  pills one pill at bedtime every night at bedtime (new script, 20mg  pills, 90 days, 3 refills).  She will call

## 2018-12-11 NOTE — Progress Notes (Signed)
Review of pertinent gastrointestinal problems:  1. GERD: she had upper endoscopy Dr. Kathrynn Ducking 2001 this showed mild acute and chronic reflux esophagitis mild antritis and slight inflammation and pyloric channel mild duodenitis. PPI has worked very well. 2. Routine risk for colon cancer;colonoscopy Ardis Hughs 08/2011 found no polyp, + hemorrhoids. Recall exam at 10 years for screening.  This service was provided via virtual visit. Only audio was used.  The patient was located at home.  I was located in my office.  The patient did consent to this virtual visit and is aware of possible charges through their insurance for this visit.  The patient is an established patient.  My certified medical assistant, Grace Bushy, contributed to this visit by contacting the patient by phone 1 or 2 business days prior to the appointment and also followed up on the recommendations I made after the visit.  Time spent on virtual visit: 23 min   HPI: This is a very pleasant 68 year old woman  I last saw her in the office in person October 2019.  At that time insurance would not pay for Zegeride.  Omeprazole did not seem to help.  Protonix did not seem to help.  Dexilant was the only thing that really helped her.  She had no alarm symptoms.  She was doing well on dexilant for a long time, then started having terrible stomach aches and she felt it was all due to the dexilant.  Was under a lot of stress (covid fears, work changes).  She stopped the dexilant completely and instead was taking pepcid OTC.  She asked for prescription for the pepcid from her PCP.  She takes a pepcid in the morning and does well until after dinner.  Then has reflux after dinner.   She's been taking famotidine 20mg  pills in AM before she eats.  She now tells me that omeprazole   No alarm symptoms (no overt gi bleeding, no weight loss, no dysphagia)  Chief complaint is GERD  ROS: complete GI ROS as described in HPI, all other review  negative.  Constitutional:  No unintentional weight loss   Past Medical History:  Diagnosis Date  . Allergy   . Fibroid   . GERD (gastroesophageal reflux disease)   . Hyperlipemia   . IBS (irritable bowel syndrome)   . Osteopenia   . Thyroid disease     Past Surgical History:  Procedure Laterality Date  . ABDOMINAL HYSTERECTOMY     TAH  . COLONOSCOPY     2001  . TRIGGER FINGER RELEASE      Current Outpatient Medications  Medication Sig Dispense Refill  . AMBULATORY NON FORMULARY MEDICATION Allergy Injection Once a week     . calcium carbonate (TUMS - DOSED IN MG ELEMENTAL CALCIUM) 500 MG chewable tablet Chew 2 tablets by mouth as needed for indigestion or heartburn.    . CRESTOR 20 MG tablet Takes one every other day     . famotidine (PEPCID) 20 MG tablet Take 20 mg by mouth daily.    Marland Kitchen levothyroxine (SYNTHROID) 75 MCG tablet Take 1 tablet by mouth every other day.    . levothyroxine (SYNTHROID, LEVOTHROID) 50 MCG tablet Take 50 mcg by mouth daily.      Marland Kitchen PATADAY 0.2 % SOLN     . Triamcinolone Acetonide (NASACORT AQ NA) Place into the nose as directed.      . Vitamin D, Ergocalciferol, (DRISDOL) 50000 UNITS CAPS Take 1 capsule (50,000 Units total) by mouth every  14 (fourteen) days. 6 capsule 4   No current facility-administered medications for this visit.     Allergies as of 12/11/2018 - Review Complete 12/07/2018  Allergen Reaction Noted  . Aspirin  06/10/2008    Family History  Problem Relation Age of Onset  . Throat cancer Sister 75       DECEASED  . Cancer Sister        Throat cancer  . Heart disease Mother   . Breast cancer Sister        Age 44's  . Heart disease Sister   . Colon cancer Neg Hx     Social History   Socioeconomic History  . Marital status: Single    Spouse name: Not on file  . Number of children: Not on file  . Years of education: Not on file  . Highest education level: Not on file  Occupational History  . Not on file  Social  Needs  . Financial resource strain: Not on file  . Food insecurity:    Worry: Not on file    Inability: Not on file  . Transportation needs:    Medical: Not on file    Non-medical: Not on file  Tobacco Use  . Smoking status: Never Smoker  . Smokeless tobacco: Never Used  Substance and Sexual Activity  . Alcohol use: Yes    Comment: Rare  . Drug use: No  . Sexual activity: Never    Birth control/protection: Surgical  Lifestyle  . Physical activity:    Days per week: Not on file    Minutes per session: Not on file  . Stress: Not on file  Relationships  . Social connections:    Talks on phone: Not on file    Gets together: Not on file    Attends religious service: Not on file    Active member of club or organization: Not on file    Attends meetings of clubs or organizations: Not on file    Relationship status: Not on file  . Intimate partner violence:    Fear of current or ex partner: Not on file    Emotionally abused: Not on file    Physically abused: Not on file    Forced sexual activity: Not on file  Other Topics Concern  . Not on file  Social History Narrative  . Not on file     Physical Exam: Unable to perform because this was a "telemed visit" due to current Covid-19 pandemic  Assessment and plan: 68 y.o. female with chronic GERD without alarm symptoms  She is not taking her antiacid medicines at the correct timing and so we discussed that again.  She has no alarm symptoms.  She is now telling me that Dexilant does not help and she would like to retry omeprazole which I am happy to do for her.  Will prescribe omeprazole 40 mg pills she knows to take 1 pill shortly before breakfast every morning.  She will change her famotidine so that it is bedtime dosing.  She will call in 1 or 2 months to let me know if these medicine dosing changes have helped or not.  Please see the "Patient Instructions" section for addition details about the plan.  Owens Loffler,  MD [facility] 12/11/2018, 9:32 AM

## 2019-01-19 DIAGNOSIS — J0100 Acute maxillary sinusitis, unspecified: Secondary | ICD-10-CM | POA: Diagnosis not present

## 2019-01-19 DIAGNOSIS — R42 Dizziness and giddiness: Secondary | ICD-10-CM | POA: Diagnosis not present

## 2019-05-22 DIAGNOSIS — Z23 Encounter for immunization: Secondary | ICD-10-CM | POA: Diagnosis not present

## 2019-11-23 ENCOUNTER — Other Ambulatory Visit: Payer: Self-pay | Admitting: Gastroenterology

## 2020-06-07 ENCOUNTER — Ambulatory Visit: Payer: Self-pay | Attending: Internal Medicine

## 2020-06-07 ENCOUNTER — Other Ambulatory Visit: Payer: Self-pay

## 2020-06-07 DIAGNOSIS — Z23 Encounter for immunization: Secondary | ICD-10-CM

## 2020-06-07 NOTE — Progress Notes (Signed)
   Covid-19 Vaccination Clinic  Name:  Patricia Carey    MRN: [id number] DOB: 13-Mar-1951  06/07/2020  Patricia Carey was observed post Covid-19 immunization for 15 minutes without incident. She was provided with Vaccine Information Sheet and instruction to access the V-Safe system.   Patricia Carey was instructed to call 911 with any severe reactions post vaccine: Marland Kitchen Difficulty breathing  . Swelling of face and throat  . A fast heartbeat  . A bad rash all over body  . Dizziness and weakness

## 2020-11-10 ENCOUNTER — Other Ambulatory Visit: Payer: Self-pay | Admitting: Gastroenterology

## 2021-04-07 ENCOUNTER — Emergency Department (HOSPITAL_COMMUNITY): Payer: Medicare Other

## 2021-04-07 ENCOUNTER — Emergency Department (HOSPITAL_COMMUNITY)
Admission: EM | Admit: 2021-04-07 | Discharge: 2021-04-08 | Disposition: A | Discharge status: Home / Self Care | Payer: Medicare Other | Attending: Emergency Medicine | Admitting: Emergency Medicine

## 2021-04-07 ENCOUNTER — Other Ambulatory Visit: Payer: Self-pay

## 2021-04-07 DIAGNOSIS — E039 Hypothyroidism, unspecified: Secondary | ICD-10-CM | POA: Diagnosis not present

## 2021-04-07 DIAGNOSIS — S82842A Displaced bimalleolar fracture of left lower leg, initial encounter for closed fracture: Secondary | ICD-10-CM | POA: Diagnosis not present

## 2021-04-07 DIAGNOSIS — W228XXA Striking against or struck by other objects, initial encounter: Secondary | ICD-10-CM | POA: Insufficient documentation

## 2021-04-07 DIAGNOSIS — M25572 Pain in left ankle and joints of left foot: Secondary | ICD-10-CM | POA: Diagnosis not present

## 2021-04-07 DIAGNOSIS — Z79899 Other long term (current) drug therapy: Secondary | ICD-10-CM | POA: Diagnosis not present

## 2021-04-07 DIAGNOSIS — S99912A Unspecified injury of left ankle, initial encounter: Secondary | ICD-10-CM | POA: Diagnosis present

## 2021-04-07 DIAGNOSIS — R202 Paresthesia of skin: Secondary | ICD-10-CM | POA: Insufficient documentation

## 2021-04-07 MED ORDER — ONDANSETRON HCL 4 MG/2ML IJ SOLN
4.0000 mg | Freq: Once | INTRAMUSCULAR | Status: AC
  Administered 2021-04-07: 4 mg via INTRAVENOUS
  Filled 2021-04-07: qty 2

## 2021-04-07 MED ORDER — MORPHINE SULFATE (PF) 4 MG/ML IV SOLN
4.0000 mg | Freq: Once | INTRAVENOUS | Status: AC
  Administered 2021-04-07: 4 mg via INTRAVENOUS
  Filled 2021-04-07: qty 1

## 2021-04-07 MED ORDER — HYDROCODONE-ACETAMINOPHEN 5-325 MG PO TABS: 1.0000 | ORAL_TABLET | Freq: Four times a day (QID) | ORAL | 0 refills | Status: DC | PRN
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
## 2021-04-07 NOTE — ED Notes (Signed)
Pt stated she is in pain and would like to have something to drink. Notified MD.
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
## 2021-04-07 NOTE — ED Notes (Signed)
Provided pt with beverage per MD approval

## 2021-04-07 NOTE — ED Notes (Signed)
Paged ortho tech 

## 2021-04-07 NOTE — ED Notes (Signed)
Provided pt with ice pack and elevated left leg per MD request.

## 2021-04-08 ENCOUNTER — Emergency Department (HOSPITAL_COMMUNITY): Payer: Medicare Other

## 2021-04-08 LAB — CBC WITH DIFFERENTIAL/PLATELET
Abs Immature Granulocytes: 0.05 10*3/uL (ref 0.00–0.07)
Basophils Absolute: 0 10*3/uL (ref 0.0–0.1)
Basophils Relative: 0 %
Eosinophils Absolute: 0 10*3/uL (ref 0.0–0.5)
Eosinophils Relative: 0 %
HCT: 34.7 % — ABNORMAL LOW (ref 36.0–46.0)
Hemoglobin: 11.3 g/dL — ABNORMAL LOW (ref 12.0–15.0)
Immature Granulocytes: 0 %
Lymphocytes Relative: 10 %
Lymphs Abs: 1.5 10*3/uL (ref 0.7–4.0)
MCH: 29.6 pg (ref 26.0–34.0)
MCHC: 32.6 g/dL (ref 30.0–36.0)
MCV: 90.8 fL (ref 80.0–100.0)
Monocytes Absolute: 0.6 10*3/uL (ref 0.1–1.0)
Monocytes Relative: 4 %
Neutro Abs: 12.3 10*3/uL — ABNORMAL HIGH (ref 1.7–7.7)
Neutrophils Relative %: 86 %
Platelets: 181 10*3/uL (ref 150–400)
RBC: 3.82 MIL/uL — ABNORMAL LOW (ref 3.87–5.11)
RDW: 12.2 % (ref 11.5–15.5)
WBC: 14.5 10*3/uL — ABNORMAL HIGH (ref 4.0–10.5)
nRBC: 0 % (ref 0.0–0.2)

## 2021-04-08 LAB — BASIC METABOLIC PANEL
Anion gap: 6 (ref 5–15)
BUN: 12 mg/dL (ref 8–23)
CO2: 20 mmol/L — ABNORMAL LOW (ref 22–32)
Calcium: 8.1 mg/dL — ABNORMAL LOW (ref 8.9–10.3)
Chloride: 112 mmol/L — ABNORMAL HIGH (ref 98–111)
Creatinine, Ser: 0.7 mg/dL (ref 0.44–1.00)
GFR, Estimated: >60 mL/min (ref >60)
Glucose, Bld: 126 mg/dL — ABNORMAL HIGH (ref 70–99)
Potassium: 5.1 mmol/L (ref 3.5–5.1)
Sodium: 138 mmol/L (ref 135–145)

## 2021-04-08 LAB — CBG MONITORING, ED
Glucose-Capillary: 153 mg/dL — ABNORMAL HIGH (ref 70–99)
Glucose-Capillary: 148 mg/dL — ABNORMAL HIGH (ref 70–99)

## 2021-04-08 LAB — TROPONIN I (HIGH SENSITIVITY)
Troponin I (High Sensitivity): 3 ng/L (ref <18)
Troponin I (High Sensitivity): 4 ng/L (ref <18)

## 2021-04-08 MED ORDER — SODIUM CHLORIDE 0.9 % IV SOLN
1000.0000 mL | INTRAVENOUS | Status: DC
  Administered 2021-04-08: 1000 mL via INTRAVENOUS

## 2021-04-08 MED ORDER — IOHEXOL 350 MG/ML SOLN
80.0000 mL | Freq: Once | INTRAVENOUS | Status: AC | PRN
  Administered 2021-04-08: 80 mL via INTRAVENOUS

## 2021-04-08 MED ORDER — ACETAMINOPHEN 500 MG PO TABS
1000.0000 mg | ORAL_TABLET | Freq: Once | ORAL | Status: AC
  Administered 2021-04-08: 1000 mg via ORAL
  Filled 2021-04-08: qty 2

## 2021-04-08 MED ORDER — SODIUM CHLORIDE 0.9 % IV BOLUS (SEPSIS)
1000.0000 mL | Freq: Once | INTRAVENOUS | Status: AC
  Administered 2021-04-08: 1000 mL via INTRAVENOUS

## 2021-04-08 NOTE — ED Notes (Signed)
Pt care taken 500 cc bolus infusing

## 2021-04-08 NOTE — ED Provider Notes (Signed)
I assumed care of this patient.  Please see previous provider note for further details of Hx, PE.  Briefly patient is a 71 y.o. female who presented for left ankle injury noted to have nondisplaced bimalleolar fracture pending splinting.  After splinted patient had a near syncopal episode where her blood pressures dropped to systolics in the [id number]. She was given IV fluids and oral hydration. Reassessed a few hours later. Upon transporting to the wheelchair patient had another syncopal episode and blood pressures dropped down to systolics in the 0000000. She complained that she had neck pain prior to the syncopal episode.  Screening labs, EKG and imaging obtained. EKG without acute ischemic changes or evidence of pericarditis. CBC with leukocytosis and mild anemia. No significant electrolyte derangements or renal sufficiency. CBG was normal. Initial troponin negative.  CT angio of the head and neck without any vascular abnormalities.  Pending delta Trope. Will allow patient to eat. Repeat orthostatics. If patient still symptomatic, consider admission for continued IV hydration. If stable she should be appropriate for discharge home.  .Critical Care  Date/Time: 04/08/2021 7:48 AM Performed by: Fatima Blank, MD Authorized by: Fatima Blank, MD   Critical care provider statement:    Critical care time (minutes):  45   Critical care was necessary to treat or prevent imminent or life-threatening deterioration of the following conditions:  Dehydration   Critical care was time spent personally by me on the following activities:  Discussions with consultants, evaluation of patient's response to treatment, examination of patient, ordering and performing treatments and interventions, ordering and review of laboratory studies, ordering and review of radiographic studies, pulse oximetry, re-evaluation of patient's condition, obtaining history from patient or surrogate and review of old  charts    Tamyra Fojtik, Grayce Sessions, MD 04/08/21 [phone]

## 2021-04-08 NOTE — ED Notes (Signed)
Attempted to call ortho and was not able to get through

## 2021-04-08 NOTE — Progress Notes (Signed)
Orthopedic Tech Progress Note Patient Details:  Patricia Carey [birth year] [id number]  Ortho Devices Type of Ortho Device: Crutches, Post (short leg) splint, Stirrup splint Ortho Device/Splint Location: lle Ortho Device/Splint Interventions: Ordered, Application, Adjustment   Post Interventions Patient Tolerated: Well Instructions Provided: Care of device, Adjustment of device  [name] 04/08/2021, 1:54 AM

## 2021-04-08 NOTE — ED Notes (Signed)
Provided pt with crackers and ginger-ale

## 2021-04-08 NOTE — ED Notes (Signed)
Pt started being lethargic and stating she thinks her sugar is low. Pt denies being a diabetic. I checked her blood sugar. CBG 153. Pt BP is low. Notified provider to come to bedside.

## 2021-04-08 NOTE — ED Notes (Signed)
Ortho at the bedside.

## 2021-04-08 NOTE — ED Notes (Signed)
EDP at the bedside. Pt stated she has not eaten all day and believe that is why she feels faint. EDP recommend a fluid bolus, snack, and drink.

## 2021-04-08 NOTE — ED Notes (Signed)
Attempted to get patient up to discharge, became faint BP dropped to 63 systolic, diaphoretic and looked as though she was going to pass out. md made aware and placed back in bed. She said that she was hurting across chest, up neck and across the top of her head. Did a ekg and drew cp labs

## 2021-04-10 DIAGNOSIS — M25572 Pain in left ankle and joints of left foot: Secondary | ICD-10-CM | POA: Diagnosis not present
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
## 2021-04-15 ENCOUNTER — Other Ambulatory Visit: Payer: Self-pay

## 2021-04-15 ENCOUNTER — Encounter (HOSPITAL_BASED_OUTPATIENT_CLINIC_OR_DEPARTMENT_OTHER): Payer: Self-pay | Admitting: Orthopedic Surgery

## 2021-04-20 DIAGNOSIS — M25572 Pain in left ankle and joints of left foot: Secondary | ICD-10-CM | POA: Diagnosis not present

## 2021-04-20 NOTE — Progress Notes (Signed)
Spoke with patient by phone, pt aware surgery moved to [place] arrive 745 am 04-21-2021 , talk omeprazole, levothyroxine. Patient vocalized understanding pre op instructions

## 2021-04-20 NOTE — Anesthesia Preprocedure Evaluation (Addendum)
Anesthesia Evaluation  Patient identified by MRN, date of birth, ID band Patient awake    Reviewed: Allergy & Precautions, NPO status , Patient's Chart, lab work & pertinent test results  Airway Mallampati: III  TM Distance: >3 FB Neck ROM: Full    Dental  (+) Teeth Intact, Dental Advisory Given   Pulmonary neg pulmonary ROS,    Pulmonary exam normal breath sounds clear to auscultation       Cardiovascular negative cardio ROS Normal cardiovascular exam Rhythm:Regular Rate:Normal     Neuro/Psych negative neurological ROS  negative psych ROS   GI/Hepatic Neg liver ROS, GERD  Medicated and Controlled,  Endo/Other  Hypothyroidism   Renal/GU negative Renal ROS  Female GU complaint     Musculoskeletal L ankle fx   Abdominal   Peds  Hematology  (+) Blood dyscrasia, anemia , hct 34.7   Anesthesia Other Findings   Reproductive/Obstetrics negative OB ROS                            Anesthesia Physical Anesthesia Plan  ASA: 2  Anesthesia Plan: General and Regional   Post-op Pain Management: GA combined w/ Regional for post-op pain   Induction: Intravenous  PONV Risk Score and Plan: 3 and Ondansetron, Dexamethasone, Midazolam and Treatment may vary due to age or medical condition  Airway Management Planned: LMA  Additional Equipment: None  Intra-op Plan:   Post-operative Plan: Extubation in OR  Informed Consent: I have reviewed the patients History and Physical, chart, labs and discussed the procedure including the risks, benefits and alternatives for the proposed anesthesia with the patient or authorized representative who has indicated his/her understanding and acceptance.     Dental advisory given  Plan Discussed with: CRNA  Anesthesia Plan Comments:       Anesthesia Quick Evaluation

## 2021-04-21 ENCOUNTER — Encounter (HOSPITAL_BASED_OUTPATIENT_CLINIC_OR_DEPARTMENT_OTHER): Payer: Self-pay | Admitting: Orthopedic Surgery

## 2021-04-21 ENCOUNTER — Ambulatory Visit (HOSPITAL_BASED_OUTPATIENT_CLINIC_OR_DEPARTMENT_OTHER): Payer: Medicare HMO | Admitting: Anesthesiology

## 2021-04-21 ENCOUNTER — Other Ambulatory Visit: Payer: Self-pay

## 2021-04-21 ENCOUNTER — Ambulatory Visit (HOSPITAL_BASED_OUTPATIENT_CLINIC_OR_DEPARTMENT_OTHER)
Admission: RE | Admit: 2021-04-21 | Discharge: 2021-04-21 | Disposition: A | Discharge status: Home / Self Care | Payer: Medicare HMO | Attending: Orthopedic Surgery | Admitting: Orthopedic Surgery

## 2021-04-21 ENCOUNTER — Encounter (HOSPITAL_BASED_OUTPATIENT_CLINIC_OR_DEPARTMENT_OTHER)
Admission: RE | Disposition: A | Discharge status: Home / Self Care | Payer: Self-pay | Source: Home / Self Care | Attending: Orthopedic Surgery

## 2021-04-21 DIAGNOSIS — S82842A Displaced bimalleolar fracture of left lower leg, initial encounter for closed fracture: Secondary | ICD-10-CM | POA: Diagnosis not present

## 2021-04-21 DIAGNOSIS — Z79890 Hormone replacement therapy (postmenopausal): Secondary | ICD-10-CM | POA: Diagnosis not present

## 2021-04-21 DIAGNOSIS — S93432A Sprain of tibiofibular ligament of left ankle, initial encounter: Secondary | ICD-10-CM | POA: Diagnosis not present

## 2021-04-21 DIAGNOSIS — S8252XA Displaced fracture of medial malleolus of left tibia, initial encounter for closed fracture: Secondary | ICD-10-CM | POA: Insufficient documentation

## 2021-04-21 DIAGNOSIS — Z79899 Other long term (current) drug therapy: Secondary | ICD-10-CM | POA: Insufficient documentation

## 2021-04-21 DIAGNOSIS — S8262XA Displaced fracture of lateral malleolus of left fibula, initial encounter for closed fracture: Secondary | ICD-10-CM | POA: Insufficient documentation

## 2021-04-21 DIAGNOSIS — X58XXXA Exposure to other specified factors, initial encounter: Secondary | ICD-10-CM | POA: Insufficient documentation

## 2021-04-21 DIAGNOSIS — S93402A Sprain of unspecified ligament of left ankle, initial encounter: Secondary | ICD-10-CM | POA: Diagnosis not present

## 2021-04-21 DIAGNOSIS — S82892A Other fracture of left lower leg, initial encounter for closed fracture: Secondary | ICD-10-CM | POA: Diagnosis not present

## 2021-04-21 DIAGNOSIS — G8918 Other acute postprocedural pain: Secondary | ICD-10-CM | POA: Diagnosis not present

## 2021-04-21 DIAGNOSIS — E039 Hypothyroidism, unspecified: Secondary | ICD-10-CM | POA: Diagnosis not present

## 2021-04-21 HISTORY — PX: ORIF ANKLE FRACTURE: SHX5408

## 2021-04-21 SURGERY — OPEN REDUCTION INTERNAL FIXATION (ORIF) ANKLE FRACTURE
Anesthesia: Monitor Anesthesia Care | Site: Ankle | Laterality: Left

## 2021-04-21 MED ORDER — LACTATED RINGERS IV SOLN: INTRAVENOUS | Status: DC

## 2021-04-21 MED ORDER — CEFAZOLIN SODIUM-DEXTROSE 2-4 GM/100ML-% IV SOLN
2.0000 g | INTRAVENOUS | Status: AC
  Administered 2021-04-21: 2 g via INTRAVENOUS

## 2021-04-21 MED ORDER — MIDAZOLAM HCL 2 MG/2ML IJ SOLN
INTRAMUSCULAR | Status: AC
  Filled 2021-04-21: qty 2

## 2021-04-21 MED ORDER — METHOCARBAMOL 500 MG PO TABS
500.0000 mg | ORAL_TABLET | Freq: Three times a day (TID) | ORAL | 0 refills | Status: DC | PRN
Start: 2021-04-21 — End: 2023-05-05

## 2021-04-21 MED ORDER — ACETAMINOPHEN 500 MG PO TABS: 1000.0000 mg | ORAL_TABLET | Freq: Once | ORAL | Status: DC

## 2021-04-21 MED ORDER — FENTANYL CITRATE (PF) 100 MCG/2ML IJ SOLN
100.0000 ug | Freq: Once | INTRAMUSCULAR | Status: AC
  Administered 2021-04-21: 100 ug via INTRAVENOUS

## 2021-04-21 MED ORDER — ONDANSETRON 4 MG PO TBDP: 4.0000 mg | ORAL_TABLET | Freq: Two times a day (BID) | ORAL | 0 refills | Status: DC | PRN

## 2021-04-21 MED ORDER — OXYCODONE HCL 5 MG PO TABS: 5.0000 mg | ORAL_TABLET | Freq: Three times a day (TID) | ORAL | 0 refills | Status: AC | PRN

## 2021-04-21 MED ORDER — DEXAMETHASONE SODIUM PHOSPHATE 10 MG/ML IJ SOLN
INTRAMUSCULAR | Status: AC
  Filled 2021-04-21: qty 1

## 2021-04-21 MED ORDER — AMISULPRIDE (ANTIEMETIC) 5 MG/2ML IV SOLN: 10.0000 mg | Freq: Once | INTRAVENOUS | Status: DC | PRN

## 2021-04-21 MED ORDER — LIDOCAINE 2% (20 MG/ML) 5 ML SYRINGE
INTRAMUSCULAR | Status: AC
  Filled 2021-04-21: qty 5

## 2021-04-21 MED ORDER — ROPIVACAINE HCL 5 MG/ML IJ SOLN
INTRAMUSCULAR | Status: DC | PRN
  Administered 2021-04-21: 40 mL via PERINEURAL

## 2021-04-21 MED ORDER — DEXAMETHASONE SODIUM PHOSPHATE 10 MG/ML IJ SOLN
INTRAMUSCULAR | Status: DC | PRN
  Administered 2021-04-21: 10 mg

## 2021-04-21 MED ORDER — ONDANSETRON HCL 4 MG/2ML IJ SOLN
INTRAMUSCULAR | Status: AC
  Filled 2021-04-21: qty 2

## 2021-04-21 MED ORDER — LIDOCAINE HCL (CARDIAC) PF 100 MG/5ML IV SOSY
PREFILLED_SYRINGE | INTRAVENOUS | Status: DC | PRN
  Administered 2021-04-21: 20 mg via INTRAVENOUS

## 2021-04-21 MED ORDER — ONDANSETRON HCL 4 MG/2ML IJ SOLN
INTRAMUSCULAR | Status: DC | PRN
Start: 2021-04-21 — End: 2021-04-21
  Administered 2021-04-21: 4 mg via INTRAVENOUS

## 2021-04-21 MED ORDER — OXYCODONE HCL 5 MG/5ML PO SOLN
5.0000 mg | Freq: Once | ORAL | Status: DC | PRN
Start: 2021-04-21 — End: 2021-04-21

## 2021-04-21 MED ORDER — HYDROMORPHONE HCL 1 MG/ML IJ SOLN: 0.2500 mg | INTRAMUSCULAR | Status: DC | PRN

## 2021-04-21 MED ORDER — EPHEDRINE SULFATE 50 MG/ML IJ SOLN
INTRAMUSCULAR | Status: DC | PRN
  Administered 2021-04-21: 5 mg via INTRAVENOUS

## 2021-04-21 MED ORDER — PROPOFOL 10 MG/ML IV BOLUS
INTRAVENOUS | Status: DC | PRN
  Administered 2021-04-21: 100 mg via INTRAVENOUS

## 2021-04-21 MED ORDER — RIVAROXABAN 10 MG PO TABS: 10.0000 mg | ORAL_TABLET | Freq: Every day | ORAL | 0 refills | Status: DC

## 2021-04-21 MED ORDER — CEFAZOLIN SODIUM-DEXTROSE 2-4 GM/100ML-% IV SOLN
INTRAVENOUS | Status: AC
  Filled 2021-04-21: qty 100

## 2021-04-21 MED ORDER — TRANEXAMIC ACID-NACL 1000-0.7 MG/100ML-% IV SOLN
INTRAVENOUS | Status: AC
  Filled 2021-04-21: qty 100

## 2021-04-21 MED ORDER — ACETAMINOPHEN 500 MG PO TABS
ORAL_TABLET | ORAL | Status: AC
  Filled 2021-04-21: qty 2

## 2021-04-21 MED ORDER — DEXAMETHASONE SODIUM PHOSPHATE 10 MG/ML IJ SOLN
8.0000 mg | Freq: Once | INTRAMUSCULAR | Status: AC
  Administered 2021-04-21: 10 mg via INTRAVENOUS

## 2021-04-21 MED ORDER — ACETAMINOPHEN 500 MG PO TABS: 1000.0000 mg | ORAL_TABLET | Freq: Four times a day (QID) | ORAL | 0 refills | Status: AC | PRN

## 2021-04-21 MED ORDER — POVIDONE-IODINE 10 % EX SWAB: 2.0000 "application " | Freq: Once | CUTANEOUS | Status: DC

## 2021-04-21 MED ORDER — FENTANYL CITRATE (PF) 100 MCG/2ML IJ SOLN
INTRAMUSCULAR | Status: AC
  Filled 2021-04-21: qty 2

## 2021-04-21 MED ORDER — MIDAZOLAM HCL 2 MG/2ML IJ SOLN
2.0000 mg | Freq: Once | INTRAMUSCULAR | Status: AC
  Administered 2021-04-21: 2 mg via INTRAVENOUS

## 2021-04-21 MED ORDER — OXYCODONE HCL 5 MG PO TABS: 5.0000 mg | ORAL_TABLET | Freq: Once | ORAL | Status: DC | PRN

## 2021-04-21 MED ORDER — PROPOFOL 10 MG/ML IV BOLUS
INTRAVENOUS | Status: AC
  Filled 2021-04-21: qty 20

## 2021-04-21 MED ORDER — TRANEXAMIC ACID-NACL 1000-0.7 MG/100ML-% IV SOLN
1000.0000 mg | INTRAVENOUS | Status: AC
  Administered 2021-04-21: 1000 mg via INTRAVENOUS

## 2021-04-21 SURGICAL SUPPLY — 81 items
APL PRP STRL LF DISP 70% ISPRP (MISCELLANEOUS)
BANDAGE ESMARK 6X9 LF (GAUZE/BANDAGES/DRESSINGS) ×1 IMPLANT
BIT DRILL 3.5X122MM AO FIT (BIT) ×1 IMPLANT
BIT DRILL CANN 2.7 (BIT) ×2
BIT DRILL SRG 2.7XCANN AO CPLG (BIT) IMPLANT
BIT DRL SRG 2.7XCANN AO CPLNG (BIT) ×1
BLADE SURG 15 STRL LF DISP TIS (BLADE) ×2 IMPLANT
BLADE SURG 15 STRL SS (BLADE) ×4
BNDG CMPR 9X6 STRL LF SNTH (GAUZE/BANDAGES/DRESSINGS) ×1
BNDG COHESIVE 4X5 TAN STRL (GAUZE/BANDAGES/DRESSINGS) ×2 IMPLANT
BNDG ELASTIC 4X5.8 VLCR STR LF (GAUZE/BANDAGES/DRESSINGS) ×2 IMPLANT
BNDG ELASTIC 6X5.8 VLCR STR LF (GAUZE/BANDAGES/DRESSINGS) ×2 IMPLANT
BNDG ESMARK 6X9 LF (GAUZE/BANDAGES/DRESSINGS) ×2
BNDG GAUZE ELAST 4 BULKY (GAUZE/BANDAGES/DRESSINGS) ×2 IMPLANT
CHLORAPREP W/TINT 26 (MISCELLANEOUS) ×1 IMPLANT
CLSR STERI-STRIP ANTIMIC 1/2X4 (GAUZE/BANDAGES/DRESSINGS) ×2 IMPLANT
COVER BACK TABLE 60X90IN (DRAPES) ×2 IMPLANT
COVER MAYO STAND STRL (DRAPES) ×2 IMPLANT
CUFF TOURN SGL QUICK 24 (TOURNIQUET CUFF)
CUFF TOURN SGL QUICK 34 (TOURNIQUET CUFF) ×2
CUFF TRNQT CYL 24X4X16.5-23 (TOURNIQUET CUFF) IMPLANT
CUFF TRNQT CYL 34X4.125X (TOURNIQUET CUFF) IMPLANT
DECANTER SPIKE VIAL GLASS SM (MISCELLANEOUS) IMPLANT
DRAPE C-ARM 35X43 STRL (DRAPES) ×2 IMPLANT
DRAPE EXTREMITY T 121X128X90 (DISPOSABLE) ×2 IMPLANT
DRAPE IMP U-DRAPE 54X76 (DRAPES) ×2 IMPLANT
DRAPE U-SHAPE 47X51 STRL (DRAPES) ×2 IMPLANT
DRILL 2.6X122MM WL AO SHAFT (BIT) ×1 IMPLANT
DRSG ADAPTIC 3X8 NADH LF (GAUZE/BANDAGES/DRESSINGS) ×1 IMPLANT
DRSG EMULSION OIL 3X3 NADH (GAUZE/BANDAGES/DRESSINGS) ×2 IMPLANT
DRSG PAD ABDOMINAL 8X10 ST (GAUZE/BANDAGES/DRESSINGS) ×2 IMPLANT
ELECT REM PT RETURN 9FT ADLT (ELECTROSURGICAL) ×2
ELECTRODE REM PT RTRN 9FT ADLT (ELECTROSURGICAL) ×1 IMPLANT
GAUZE 4X4 16PLY ~~LOC~~+RFID DBL (SPONGE) ×2 IMPLANT
GAUZE SPONGE 4X4 12PLY STRL (GAUZE/BANDAGES/DRESSINGS) ×2 IMPLANT
GAUZE SPONGE 4X4 12PLY STRL LF (GAUZE/BANDAGES/DRESSINGS) ×1 IMPLANT
GLOVE SRG 8 PF TXTR STRL LF DI (GLOVE) ×2 IMPLANT
GLOVE SURG ENC MOIS LTX SZ7.5 (GLOVE) ×4 IMPLANT
GLOVE SURG UNDER POLY LF SZ8 (GLOVE) ×4
GOWN STRL REUS W/TWL LRG LVL3 (GOWN DISPOSABLE) ×6 IMPLANT
IMPL TIGHTROP W/DRV K-LESS (Anchor) IMPLANT
IMPLANT TIGHTROPE W/DRV K-LESS (Anchor) ×2 IMPLANT
K-WIRE ORTHOPEDIC 1.4X150L (WIRE) ×2
KIT TURNOVER CYSTO (KITS) ×2 IMPLANT
KWIRE ORTHOPEDIC 1.4X150L (WIRE) IMPLANT
NEEDLE HYPO 22GX1.5 SAFETY (NEEDLE) IMPLANT
NS IRRIG 1000ML POUR BTL (IV SOLUTION) ×2 IMPLANT
PACK BASIN DAY SURGERY FS (CUSTOM PROCEDURE TRAY) ×2 IMPLANT
PAD CAST 4YDX4 CTTN HI CHSV (CAST SUPPLIES) ×1 IMPLANT
PADDING CAST ABS 4INX4YD NS (CAST SUPPLIES) ×2
PADDING CAST ABS COTTON 4X4 ST (CAST SUPPLIES) ×2 IMPLANT
PADDING CAST COTTON 4X4 STRL (CAST SUPPLIES) ×2
PADDING CAST COTTON 6X4 STRL (CAST SUPPLIES) ×2 IMPLANT
PENCIL SMOKE EVACUATOR (MISCELLANEOUS) ×2 IMPLANT
PLATE 1/3 TUBULAR 7H (Plate) ×1 IMPLANT
SCREW CANCELLOUS 4.0X14 (Screw) ×3 IMPLANT
SCREW CANN 44X15X4XSLF DRL (Screw) IMPLANT
SCREW CANNULATED 4.0X44MM (Screw) ×4 IMPLANT
SCREW CORTEX ST MATTA 3.5X14 (Screw) ×3 IMPLANT
SCREW CORTEX ST MATTA 3.5X22MM (Screw) ×1 IMPLANT
SPLINT FAST PLASTER 5X30 (CAST SUPPLIES) ×20
SPLINT PLASTER CAST FAST 5X30 (CAST SUPPLIES) ×20 IMPLANT
SPLINT PLASTER CAST XFAST 5X30 (CAST SUPPLIES) IMPLANT
SPLINT PLASTER XFAST SET 5X30 (CAST SUPPLIES) ×1
SPONGE T-LAP 4X18 ~~LOC~~+RFID (SPONGE) ×2 IMPLANT
SUCTION FRAZIER HANDLE 10FR (MISCELLANEOUS) ×2
SUCTION TUBE FRAZIER 10FR DISP (MISCELLANEOUS) ×1 IMPLANT
SUT ETHILON 3 0 PS 1 (SUTURE) ×2 IMPLANT
SUT MNCRL AB 4-0 PS2 18 (SUTURE) IMPLANT
SUT MON AB 2-0 CT1 36 (SUTURE) IMPLANT
SUT MON AB 3-0 SH 27 (SUTURE)
SUT MON AB 3-0 SH27 (SUTURE) IMPLANT
SUT VIC AB 0 CT1 36 (SUTURE) ×2 IMPLANT
SUT VIC AB 2-0 SH 27 (SUTURE)
SUT VIC AB 2-0 SH 27XBRD (SUTURE) IMPLANT
SYR BULB EAR ULCER 3OZ GRN STR (SYRINGE) ×2 IMPLANT
SYR CONTROL 10ML LL (SYRINGE) IMPLANT
TOWEL OR 17X26 10 PK STRL BLUE (TOWEL DISPOSABLE) ×4 IMPLANT
TRAY DSU PREP LF (CUSTOM PROCEDURE TRAY) ×1 IMPLANT
TUBE CONNECTING 12X1/4 (SUCTIONS) ×2 IMPLANT
UNDERPAD 30X36 HEAVY ABSORB (UNDERPADS AND DIAPERS) ×2 IMPLANT

## 2021-04-21 NOTE — Anesthesia Postprocedure Evaluation (Signed)
Anesthesia Post Note  Patient: Patricia Carey  Procedure(s) Performed: OPEN REDUCTION INTERNAL FIXATION (ORIF) ANKLE FRACTURE WITH ORIF SYNDESMOSIS (Left: Ankle)     Patient location during evaluation: PACU Anesthesia Type: Regional and General Level of consciousness: awake and alert, oriented and patient cooperative Pain management: pain level controlled Vital Signs Assessment: post-procedure vital signs reviewed and stable Respiratory status: spontaneous breathing, nonlabored ventilation and respiratory function stable Cardiovascular status: blood pressure returned to baseline and stable Postop Assessment: no apparent nausea or vomiting Anesthetic complications: no   No notable events documented.  Last Vitals:  Vitals:   04/21/21 1042 04/21/21 1045  BP: 138/70 138/71  Pulse: 74 68  Resp: (!) 22 14  Temp: (!) 36.3 C   SpO2: 99% 96%    Last Pain:  Vitals:   04/21/21 1042  TempSrc:   PainSc: [place]

## 2021-04-21 NOTE — Discharge Instructions (Addendum)
POST-OPERATIVE OPIOID TAPER INSTRUCTIONS: It is important to wean off of your opioid medication as soon as possible. If you do not need pain medication after your surgery it is ok to stop day one. Opioids include: Codeine, Hydrocodone(Norco, Vicodin), Oxycodone(Percocet, oxycontin) and hydromorphone amongst others.  Long term and even short term use of opiods can cause: Increased pain response Dependence Constipation Depression Respiratory depression And more.  Withdrawal symptoms can include Flu like symptoms Nausea, vomiting And more Techniques to manage these symptoms Hydrate well Eat regular healthy meals Stay active Use relaxation techniques(deep breathing, meditating, yoga) Do Not substitute Alcohol to help with tapering If you have been on opioids for less than two weeks and do not have pain than it is ok to stop all together.  Plan to wean off of opioids This plan should start within one week post op of your joint replacement. Maintain the same interval or time between taking each dose and first decrease the dose.  Cut the total daily intake of opioids by one tablet each day Next start to increase the time between doses. The last dose that should be eliminated is the evening dose.       Post Anesthesia Home Care Instructions  Activity: Get plenty of rest for the remainder of the day. A responsible individual must stay with you for 24 hours following the procedure.  For the next 24 hours, DO NOT: -Drive a car -Paediatric nurse -Drink alcoholic beverages -Take any medication unless instructed by your physician -Make any legal decisions or sign important papers.  Meals: Start with liquid foods such as gelatin or soup. Progress to regular foods as tolerated. Avoid greasy, spicy, heavy foods. If nausea and/or vomiting occur, drink only clear liquids until the nausea and/or vomiting subsides. Call your physician if vomiting continues.  Special  Instructions/Symptoms: Your throat may feel dry or sore from the anesthesia or the breathing tube placed in your throat during surgery. If this causes discomfort, gargle with warm salt water. The discomfort should disappear within 24 hours. Regional Anesthesia Blocks  1. Numbness or the inability to move the "blocked" extremity may last from 3-48 hours after placement. The length of time depends on the medication injected and your individual response to the medication. If the numbness is not going away after 48 hours, call your surgeon.  2. The extremity that is blocked will need to be protected until the numbness is gone and the  Strength has returned. Because you cannot feel it, you will need to take extra care to avoid injury. Because it may be weak, you may have difficulty moving it or using it. You may not know what position it is in without looking at it while the block is in effect.  3. For blocks in the legs and feet, returning to weight bearing and walking needs to be done carefully. You will need to wait until the numbness is entirely gone and the strength has returned. You should be able to move your leg and foot normally before you try and bear weight or walk. You will need someone to be with you when you first try to ensure you do not fall and possibly risk injury.  4. Bruising and tenderness at the needle site are common side effects and will resolve in a few days.  5. Persistent numbness or new problems with movement should be communicated to the surgeon or the [place] [phone] [place] [phone]).

## 2021-04-21 NOTE — Op Note (Signed)
04/21/2021  10:45 AM  PATIENT:  Patricia Carey    PRE-OPERATIVE DIAGNOSIS:  LEFT ANKLE FRACTURE  POST-OPERATIVE DIAGNOSIS:  Same  PROCEDURE:  OPEN REDUCTION INTERNAL FIXATION (ORIF) ANKLE FRACTURE WITH ORIF SYNDESMOSIS  SURGEON:  Renette Butters, MD  ASSISTANT: Aggie Moats, PA-C, he was present and scrubbed throughout the case, critical for completion in a timely fashion, and for retraction, instrumentation, and closure.   ANESTHESIA:   gen   PREOPERATIVE INDICATIONS:  [name] is a  70 y.o. female with a diagnosis of LEFT ANKLE FRACTURE who failed conservative measures and elected for surgical management.    The risks benefits and alternatives were discussed with the patient preoperatively including but not limited to the risks of infection, bleeding, nerve injury, cardiopulmonary complications, the need for revision surgery, among others, and the patient was willing to proceed.  OPERATIVE IMPLANTS: stryker plate, arthrex tightrope  OPERATIVE FINDINGS: Unstable ankle fracture. Stable syndesmosis post op  BLOOD LOSS: min  COMPLICATIONS: none  TOURNIQUET TIME: 45  OPERATIVE PROCEDURE:  Patient was identified in the preoperative holding area and site was marked by me He was transported to the operating theater and placed on the table in supine position taking care to pad all bony prominences. After a preincinduction time out anesthesia was induced. The left lower lower extremity was prepped and draped in normal sterile fashion and a pre-incision timeout was performed. [place] received ancef for preoperative antibiotics.   I made a lateral incision of roughly 7 cm dissection was carried down sharply to the distal fibula and then spreading dissection was used proximally to protect the superficial peroneal nerve. I sharply incised the periosteum and took care to protect the peroneal tendons. I then debrided the fracture site and performed a reduction maneuver which was held  in place with a clamp.   I placed a lag screw across the fracture  I then selected a 7-hole one third tubular plate and placed in a neutralization fashion care was taken distally so as not to penetrate the joint with the cancellus screws.  I used fluoro to place 2 percutaneous screws in the medial mal. I did these percutaneously because of her dermal injury with the blisters.   I stressed the syndesmosis and it was unstable. I placed a tightrope across this.   The wound was then thoroughly irrigated and closed using a 0 Vicryl and absorbable Monocryl sutures. He was placed in a short leg splint.   POST OPERATIVE PLAN: Non-weightbearing. DVT prophylaxis will consist of mobilization and chemical px

## 2021-04-21 NOTE — Transfer of Care (Signed)
Immediate Anesthesia Transfer of Care Note  Patient: Patricia Carey  Procedure(s) Performed: OPEN REDUCTION INTERNAL FIXATION (ORIF) ANKLE FRACTURE WITH ORIF SYNDESMOSIS (Left: Ankle)  Patient Location: PACU  Anesthesia Type:General  Level of Consciousness: awake, alert  and oriented  Airway & Oxygen Therapy: Patient Spontanous Breathing and Patient connected to nasal cannula oxygen  Post-op Assessment: Report given to RN and Post -op Vital signs reviewed and stable  Post vital signs: Reviewed and stable  Last Vitals:  Vitals Value Taken Time  BP 138/70 04/21/21 1037  Temp    Pulse 68 04/21/21 1045  Resp 14 04/21/21 1045  SpO2 96 % 04/21/21 1045  Vitals shown include unvalidated device data.  Last Pain:  Vitals:   04/21/21 0808  TempSrc: [place]  PainSc: 0-No pain         Complications: No notable events documented.

## 2021-04-21 NOTE — Anesthesia Procedure Notes (Signed)
Anesthesia Regional Block: Popliteal block   Pre-Anesthetic Checklist: , timeout performed,  Correct Patient, Correct Site, Correct Laterality,  Correct Procedure, Correct Position, site marked,  Risks and benefits discussed,  Surgical consent,  Pre-op evaluation,  At surgeon's request and post-op pain management  Laterality: Left  Prep: Maximum Sterile Barrier Precautions used, chloraprep       Needles:  Injection technique: Single-shot  Needle Type: Echogenic Stimulator Needle     Needle Length: 9cm  Needle Gauge: 22     Additional Needles:   Procedures:,,,, ultrasound used (permanent image in chart),,    Narrative:  Start time: 04/21/2021 8:55 AM End time: 04/21/2021 9:00 AM Injection made incrementally with aspirations every 5 mL.  Performed by: Personally  Anesthesiologist: Pervis Hocking, DO  Additional Notes: Monitors applied. No increased pain on injection. No increased resistance to injection. Injection made in 5cc increments. Good needle visualization. Patient tolerated procedure well.

## 2021-04-21 NOTE — Anesthesia Procedure Notes (Signed)
Procedure Name: LMA Insertion Date/Time: 04/21/2021 9:15 AM Performed by: Bufford Spikes, CRNA Pre-anesthesia Checklist: Patient identified, Emergency Drugs available, Suction available and Patient being monitored Patient Re-evaluated:Patient Re-evaluated prior to induction Oxygen Delivery Method: Circle system utilized Preoxygenation: Pre-oxygenation with 100% oxygen Induction Type: IV induction Ventilation: Mask ventilation without difficulty LMA: LMA inserted LMA Size: 4.0 Number of attempts: 1 Placement Confirmation: positive ETCO2 Tube secured with: Tape Dental Injury: Teeth and Oropharynx as per pre-operative assessment

## 2021-04-21 NOTE — Anesthesia Procedure Notes (Signed)
Anesthesia Regional Block: Adductor canal block   Pre-Anesthetic Checklist: , timeout performed,  Correct Patient, Correct Site, Correct Laterality,  Correct Procedure, Correct Position, site marked,  Risks and benefits discussed,  Surgical consent,  Pre-op evaluation,  At surgeon's request and post-op pain management  Laterality: Left  Prep: Maximum Sterile Barrier Precautions used, chloraprep       Needles:  Injection technique: Single-shot  Needle Type: Echogenic Stimulator Needle     Needle Length: 9cm  Needle Gauge: 22     Additional Needles:   Procedures:,,,, ultrasound used (permanent image in chart),,    Narrative:  Start time: 04/21/2021 9:00 AM End time: 04/21/2021 9:05 AM Injection made incrementally with aspirations every 5 mL.  Performed by: Personally  Anesthesiologist: Pervis Hocking, DO  Additional Notes: Monitors applied. No increased pain on injection. No increased resistance to injection. Injection made in 5cc increments. Good needle visualization. Patient tolerated procedure well.

## 2021-04-21 NOTE — Progress Notes (Addendum)
Assisted Dr. Doroteo Glassman with left, ultrasound guided, popliteal, adductor block. Side rails up, monitors on throughout procedure. See vital signs in flow sheet. Tolerated Procedure well.

## 2021-04-21 NOTE — Interval H&P Note (Signed)
History and Physical Interval Note:  04/21/2021 7:35 AM  Patricia Carey  has presented today for surgery, with the diagnosis of LEFT ANKLE FRACTURE.  The various methods of treatment have been discussed with the patient and family. After consideration of risks, benefits and other options for treatment, the patient has consented to  Procedure(s): OPEN REDUCTION INTERNAL FIXATION (ORIF) ANKLE FRACTURE WITH ORIF SYNDESMOSIS (Left) as a surgical intervention.  The patient's history has been reviewed, patient examined, no change in status, stable for surgery.  I have reviewed the patient's chart and labs.  Questions were answered to the patient's satisfaction.     Renette Butters

## 2021-04-22 ENCOUNTER — Encounter (HOSPITAL_BASED_OUTPATIENT_CLINIC_OR_DEPARTMENT_OTHER): Payer: Self-pay | Admitting: Orthopedic Surgery

## 2021-05-01 DIAGNOSIS — S82842D Displaced bimalleolar fracture of left lower leg, subsequent encounter for closed fracture with routine healing: Secondary | ICD-10-CM | POA: Diagnosis not present

## 2021-05-13 ENCOUNTER — Other Ambulatory Visit: Payer: Self-pay

## 2021-05-13 MED ORDER — OMEPRAZOLE 40 MG PO CPDR: DELAYED_RELEASE_CAPSULE | ORAL | 0 refills | Status: AC

## 2021-06-03 DIAGNOSIS — S93432D Sprain of tibiofibular ligament of left ankle, subsequent encounter: Secondary | ICD-10-CM | POA: Diagnosis not present

## 2021-06-03 DIAGNOSIS — S82842D Displaced bimalleolar fracture of left lower leg, subsequent encounter for closed fracture with routine healing: Secondary | ICD-10-CM | POA: Diagnosis not present

## 2021-06-08 DIAGNOSIS — M25472 Effusion, left ankle: Secondary | ICD-10-CM | POA: Diagnosis not present

## 2021-06-08 DIAGNOSIS — M6281 Muscle weakness (generalized): Secondary | ICD-10-CM | POA: Diagnosis not present

## 2021-06-08 DIAGNOSIS — R262 Difficulty in walking, not elsewhere classified: Secondary | ICD-10-CM | POA: Diagnosis not present

## 2021-06-08 DIAGNOSIS — M25672 Stiffness of left ankle, not elsewhere classified: Secondary | ICD-10-CM | POA: Diagnosis not present

## 2021-06-11 DIAGNOSIS — R262 Difficulty in walking, not elsewhere classified: Secondary | ICD-10-CM | POA: Diagnosis not present

## 2021-06-11 DIAGNOSIS — M6281 Muscle weakness (generalized): Secondary | ICD-10-CM | POA: Diagnosis not present

## 2021-06-11 DIAGNOSIS — M25472 Effusion, left ankle: Secondary | ICD-10-CM | POA: Diagnosis not present

## 2021-06-11 DIAGNOSIS — M25672 Stiffness of left ankle, not elsewhere classified: Secondary | ICD-10-CM | POA: Diagnosis not present

## 2021-06-16 DIAGNOSIS — R262 Difficulty in walking, not elsewhere classified: Secondary | ICD-10-CM | POA: Diagnosis not present

## 2021-06-16 DIAGNOSIS — M25472 Effusion, left ankle: Secondary | ICD-10-CM | POA: Diagnosis not present

## 2021-06-16 DIAGNOSIS — M6281 Muscle weakness (generalized): Secondary | ICD-10-CM | POA: Diagnosis not present

## 2021-06-16 DIAGNOSIS — M25672 Stiffness of left ankle, not elsewhere classified: Secondary | ICD-10-CM | POA: Diagnosis not present

## 2021-06-18 DIAGNOSIS — M25472 Effusion, left ankle: Secondary | ICD-10-CM | POA: Diagnosis not present

## 2021-06-18 DIAGNOSIS — R262 Difficulty in walking, not elsewhere classified: Secondary | ICD-10-CM | POA: Diagnosis not present

## 2021-06-18 DIAGNOSIS — M6281 Muscle weakness (generalized): Secondary | ICD-10-CM | POA: Diagnosis not present

## 2021-06-18 DIAGNOSIS — M25672 Stiffness of left ankle, not elsewhere classified: Secondary | ICD-10-CM | POA: Diagnosis not present

## 2021-06-22 DIAGNOSIS — R262 Difficulty in walking, not elsewhere classified: Secondary | ICD-10-CM | POA: Diagnosis not present

## 2021-06-22 DIAGNOSIS — M25672 Stiffness of left ankle, not elsewhere classified: Secondary | ICD-10-CM | POA: Diagnosis not present

## 2021-06-22 DIAGNOSIS — M6281 Muscle weakness (generalized): Secondary | ICD-10-CM | POA: Diagnosis not present

## 2021-06-22 DIAGNOSIS — M25472 Effusion, left ankle: Secondary | ICD-10-CM | POA: Diagnosis not present

## 2021-06-25 DIAGNOSIS — M25672 Stiffness of left ankle, not elsewhere classified: Secondary | ICD-10-CM | POA: Diagnosis not present

## 2021-06-25 DIAGNOSIS — M6281 Muscle weakness (generalized): Secondary | ICD-10-CM | POA: Diagnosis not present

## 2021-06-25 DIAGNOSIS — R262 Difficulty in walking, not elsewhere classified: Secondary | ICD-10-CM | POA: Diagnosis not present

## 2021-06-25 DIAGNOSIS — M25472 Effusion, left ankle: Secondary | ICD-10-CM | POA: Diagnosis not present

## 2021-06-29 DIAGNOSIS — M25472 Effusion, left ankle: Secondary | ICD-10-CM | POA: Diagnosis not present

## 2021-06-29 DIAGNOSIS — M25672 Stiffness of left ankle, not elsewhere classified: Secondary | ICD-10-CM | POA: Diagnosis not present

## 2021-06-29 DIAGNOSIS — M6281 Muscle weakness (generalized): Secondary | ICD-10-CM | POA: Diagnosis not present

## 2021-06-29 DIAGNOSIS — R262 Difficulty in walking, not elsewhere classified: Secondary | ICD-10-CM | POA: Diagnosis not present

## 2021-07-06 DIAGNOSIS — M25472 Effusion, left ankle: Secondary | ICD-10-CM | POA: Diagnosis not present

## 2021-07-07 DIAGNOSIS — M25472 Effusion, left ankle: Secondary | ICD-10-CM | POA: Diagnosis not present

## 2021-07-07 DIAGNOSIS — R262 Difficulty in walking, not elsewhere classified: Secondary | ICD-10-CM | POA: Diagnosis not present

## 2021-07-07 DIAGNOSIS — M6281 Muscle weakness (generalized): Secondary | ICD-10-CM | POA: Diagnosis not present

## 2021-07-07 DIAGNOSIS — M25672 Stiffness of left ankle, not elsewhere classified: Secondary | ICD-10-CM | POA: Diagnosis not present

## 2021-07-14 DIAGNOSIS — M6281 Muscle weakness (generalized): Secondary | ICD-10-CM | POA: Diagnosis not present

## 2021-07-14 DIAGNOSIS — M25672 Stiffness of left ankle, not elsewhere classified: Secondary | ICD-10-CM | POA: Diagnosis not present

## 2021-07-14 DIAGNOSIS — R262 Difficulty in walking, not elsewhere classified: Secondary | ICD-10-CM | POA: Diagnosis not present

## 2021-07-14 DIAGNOSIS — M25472 Effusion, left ankle: Secondary | ICD-10-CM | POA: Diagnosis not present

## 2021-07-16 DIAGNOSIS — E039 Hypothyroidism, unspecified: Secondary | ICD-10-CM | POA: Diagnosis not present

## 2021-07-16 DIAGNOSIS — R262 Difficulty in walking, not elsewhere classified: Secondary | ICD-10-CM | POA: Diagnosis not present

## 2021-07-16 DIAGNOSIS — E782 Mixed hyperlipidemia: Secondary | ICD-10-CM | POA: Diagnosis not present

## 2021-07-16 DIAGNOSIS — M6281 Muscle weakness (generalized): Secondary | ICD-10-CM | POA: Diagnosis not present

## 2021-07-16 DIAGNOSIS — M25672 Stiffness of left ankle, not elsewhere classified: Secondary | ICD-10-CM | POA: Diagnosis not present

## 2021-07-16 DIAGNOSIS — M25472 Effusion, left ankle: Secondary | ICD-10-CM | POA: Diagnosis not present

## 2021-07-23 DIAGNOSIS — M6281 Muscle weakness (generalized): Secondary | ICD-10-CM | POA: Diagnosis not present

## 2021-07-23 DIAGNOSIS — R262 Difficulty in walking, not elsewhere classified: Secondary | ICD-10-CM | POA: Diagnosis not present

## 2021-07-23 DIAGNOSIS — M25472 Effusion, left ankle: Secondary | ICD-10-CM | POA: Diagnosis not present

## 2021-07-23 DIAGNOSIS — M25672 Stiffness of left ankle, not elsewhere classified: Secondary | ICD-10-CM | POA: Diagnosis not present

## 2021-07-28 DIAGNOSIS — M25472 Effusion, left ankle: Secondary | ICD-10-CM | POA: Diagnosis not present

## 2021-07-28 DIAGNOSIS — R262 Difficulty in walking, not elsewhere classified: Secondary | ICD-10-CM | POA: Diagnosis not present

## 2021-07-28 DIAGNOSIS — M25672 Stiffness of left ankle, not elsewhere classified: Secondary | ICD-10-CM | POA: Diagnosis not present

## 2021-07-28 DIAGNOSIS — M6281 Muscle weakness (generalized): Secondary | ICD-10-CM | POA: Diagnosis not present

## 2021-08-07 DIAGNOSIS — R262 Difficulty in walking, not elsewhere classified: Secondary | ICD-10-CM | POA: Diagnosis not present

## 2021-08-07 DIAGNOSIS — M25472 Effusion, left ankle: Secondary | ICD-10-CM | POA: Diagnosis not present

## 2021-08-07 DIAGNOSIS — M25672 Stiffness of left ankle, not elsewhere classified: Secondary | ICD-10-CM | POA: Diagnosis not present

## 2021-08-07 DIAGNOSIS — M6281 Muscle weakness (generalized): Secondary | ICD-10-CM | POA: Diagnosis not present

## 2021-08-11 DIAGNOSIS — M25672 Stiffness of left ankle, not elsewhere classified: Secondary | ICD-10-CM | POA: Diagnosis not present

## 2021-08-11 DIAGNOSIS — M25472 Effusion, left ankle: Secondary | ICD-10-CM | POA: Diagnosis not present

## 2021-08-11 DIAGNOSIS — M6281 Muscle weakness (generalized): Secondary | ICD-10-CM | POA: Diagnosis not present

## 2021-08-11 DIAGNOSIS — R262 Difficulty in walking, not elsewhere classified: Secondary | ICD-10-CM | POA: Diagnosis not present

## 2021-08-16 ENCOUNTER — Encounter: Payer: Self-pay | Admitting: Gastroenterology

## 2021-08-20 DIAGNOSIS — M25472 Effusion, left ankle: Secondary | ICD-10-CM | POA: Diagnosis not present

## 2021-08-20 DIAGNOSIS — M25672 Stiffness of left ankle, not elsewhere classified: Secondary | ICD-10-CM | POA: Diagnosis not present

## 2021-08-20 DIAGNOSIS — R262 Difficulty in walking, not elsewhere classified: Secondary | ICD-10-CM | POA: Diagnosis not present

## 2021-08-20 DIAGNOSIS — M6281 Muscle weakness (generalized): Secondary | ICD-10-CM | POA: Diagnosis not present

## 2021-08-26 DIAGNOSIS — M25562 Pain in left knee: Secondary | ICD-10-CM | POA: Diagnosis not present

## 2021-08-26 DIAGNOSIS — M25472 Effusion, left ankle: Secondary | ICD-10-CM | POA: Diagnosis not present

## 2021-10-07 DIAGNOSIS — Z1231 Encounter for screening mammogram for malignant neoplasm of breast: Secondary | ICD-10-CM | POA: Diagnosis not present

## 2021-11-20 IMAGING — CR DG KNEE COMPLETE 4+V*L*
4 series · 4 of 4 positions shown · non-contrast
Comparison: None.

CLINICAL DATA: Leg pain/injury

EXAM:
LEFT KNEE - COMPLETE 4+ VIEW

[knee ap]
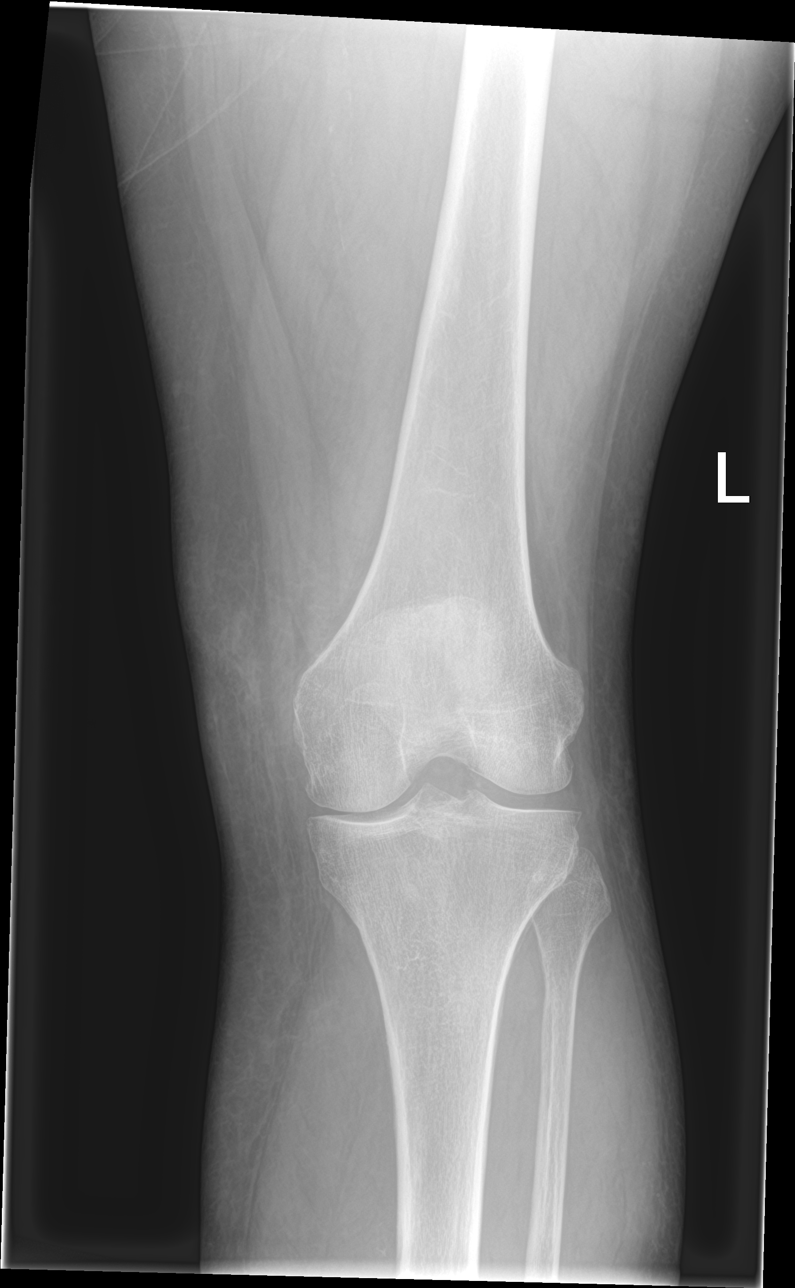

[knee lat]
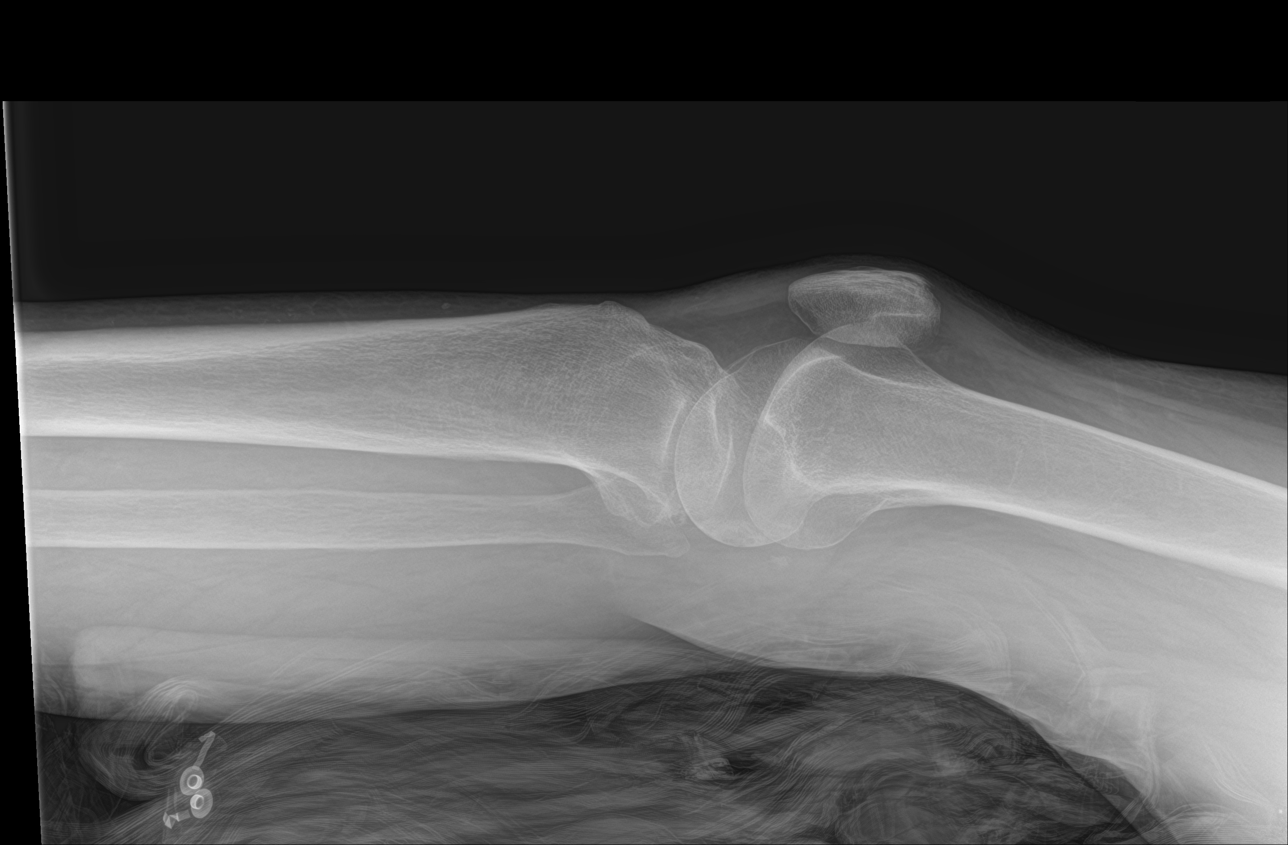

[knee obl (1 of 2)]
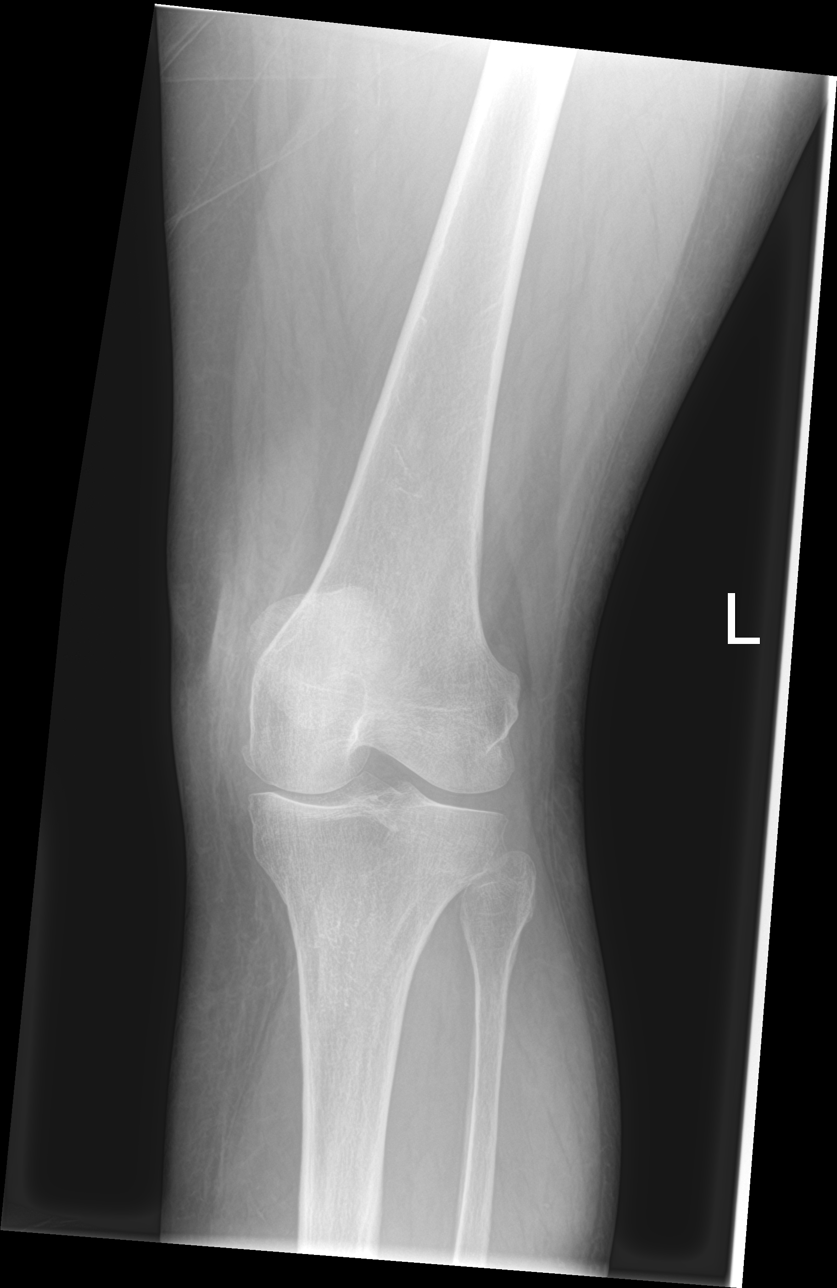

[knee obl (2 of 2)]
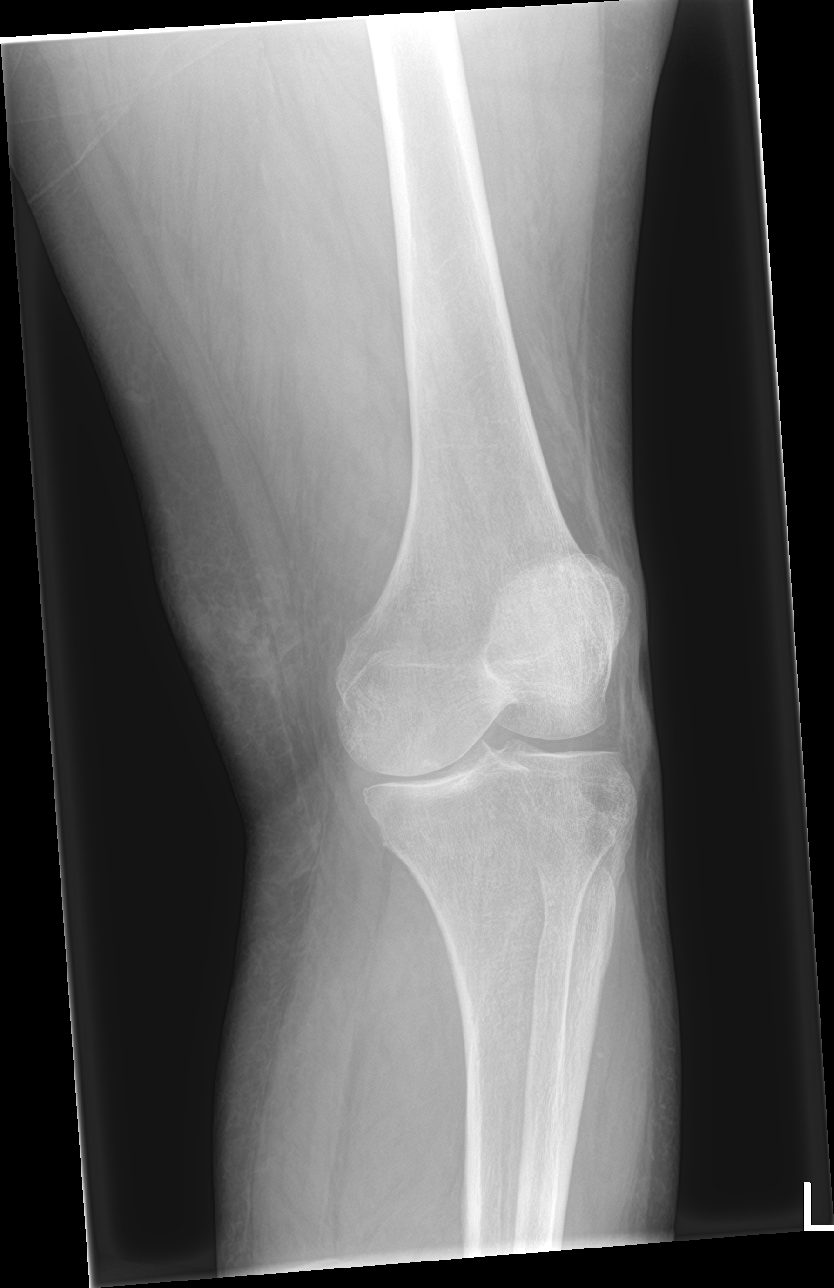

[4 of 4 positions shown; findings below may reference images not displayed]

FINDINGS: No fracture or dislocation is seen.

The joint spaces are preserved.

The visualized soft tissues are unremarkable.

No suprapatellar knee joint effusion.
IMPRESSION: Negative.

## 2021-11-25 DIAGNOSIS — H52223 Regular astigmatism, bilateral: Secondary | ICD-10-CM | POA: Diagnosis not present

## 2021-11-25 DIAGNOSIS — Z0100 Encounter for examination of eyes and vision without abnormal findings: Secondary | ICD-10-CM | POA: Diagnosis not present

## 2022-01-18 DIAGNOSIS — M25572 Pain in left ankle and joints of left foot: Secondary | ICD-10-CM | POA: Diagnosis not present

## 2022-01-18 DIAGNOSIS — M545 Low back pain, unspecified: Secondary | ICD-10-CM | POA: Diagnosis not present

## 2022-01-18 DIAGNOSIS — M85852 Other specified disorders of bone density and structure, left thigh: Secondary | ICD-10-CM | POA: Diagnosis not present

## 2022-01-18 DIAGNOSIS — E559 Vitamin D deficiency, unspecified: Secondary | ICD-10-CM | POA: Diagnosis not present

## 2022-01-18 DIAGNOSIS — E782 Mixed hyperlipidemia: Secondary | ICD-10-CM | POA: Diagnosis not present

## 2022-01-18 DIAGNOSIS — K219 Gastro-esophageal reflux disease without esophagitis: Secondary | ICD-10-CM | POA: Diagnosis not present

## 2022-01-18 DIAGNOSIS — E039 Hypothyroidism, unspecified: Secondary | ICD-10-CM | POA: Diagnosis not present

## 2022-01-18 DIAGNOSIS — M25549 Pain in joints of unspecified hand: Secondary | ICD-10-CM | POA: Diagnosis not present

## 2022-01-18 DIAGNOSIS — R7303 Prediabetes: Secondary | ICD-10-CM | POA: Diagnosis not present

## 2022-01-18 DIAGNOSIS — R002 Palpitations: Secondary | ICD-10-CM | POA: Diagnosis not present

## 2022-01-18 DIAGNOSIS — Z0000 Encounter for general adult medical examination without abnormal findings: Secondary | ICD-10-CM | POA: Diagnosis not present

## 2022-01-26 DIAGNOSIS — S93492A Sprain of other ligament of left ankle, initial encounter: Secondary | ICD-10-CM | POA: Diagnosis not present

## 2022-01-26 DIAGNOSIS — S93602A Unspecified sprain of left foot, initial encounter: Secondary | ICD-10-CM | POA: Diagnosis not present

## 2022-01-26 DIAGNOSIS — M25561 Pain in right knee: Secondary | ICD-10-CM | POA: Diagnosis not present

## 2022-03-08 DIAGNOSIS — E039 Hypothyroidism, unspecified: Secondary | ICD-10-CM | POA: Diagnosis not present

## 2022-03-10 DIAGNOSIS — M65332 Trigger finger, left middle finger: Secondary | ICD-10-CM | POA: Diagnosis not present

## 2022-03-10 DIAGNOSIS — M65331 Trigger finger, right middle finger: Secondary | ICD-10-CM | POA: Diagnosis not present

## 2022-03-10 DIAGNOSIS — M65342 Trigger finger, left ring finger: Secondary | ICD-10-CM | POA: Diagnosis not present

## 2022-03-10 DIAGNOSIS — M65341 Trigger finger, right ring finger: Secondary | ICD-10-CM | POA: Diagnosis not present

## 2022-04-21 DIAGNOSIS — M65341 Trigger finger, right ring finger: Secondary | ICD-10-CM | POA: Diagnosis not present

## 2022-04-21 DIAGNOSIS — M65332 Trigger finger, left middle finger: Secondary | ICD-10-CM | POA: Diagnosis not present

## 2022-04-21 DIAGNOSIS — M65331 Trigger finger, right middle finger: Secondary | ICD-10-CM | POA: Diagnosis not present

## 2022-04-21 DIAGNOSIS — M65342 Trigger finger, left ring finger: Secondary | ICD-10-CM | POA: Diagnosis not present

## 2022-10-13 DIAGNOSIS — Z1231 Encounter for screening mammogram for malignant neoplasm of breast: Secondary | ICD-10-CM | POA: Diagnosis not present

## 2022-10-21 DIAGNOSIS — M65341 Trigger finger, right ring finger: Secondary | ICD-10-CM | POA: Diagnosis not present

## 2022-10-21 DIAGNOSIS — M65332 Trigger finger, left middle finger: Secondary | ICD-10-CM | POA: Diagnosis not present

## 2022-11-18 DIAGNOSIS — M65341 Trigger finger, right ring finger: Secondary | ICD-10-CM | POA: Diagnosis not present

## 2022-11-18 DIAGNOSIS — M65332 Trigger finger, left middle finger: Secondary | ICD-10-CM | POA: Diagnosis not present

## 2023-01-13 DIAGNOSIS — Z0100 Encounter for examination of eyes and vision without abnormal findings: Secondary | ICD-10-CM | POA: Diagnosis not present

## 2023-01-13 DIAGNOSIS — H524 Presbyopia: Secondary | ICD-10-CM | POA: Diagnosis not present

## 2023-02-15 DIAGNOSIS — R7303 Prediabetes: Secondary | ICD-10-CM | POA: Diagnosis not present

## 2023-02-15 DIAGNOSIS — E782 Mixed hyperlipidemia: Secondary | ICD-10-CM | POA: Diagnosis not present

## 2023-02-15 DIAGNOSIS — E039 Hypothyroidism, unspecified: Secondary | ICD-10-CM | POA: Diagnosis not present

## 2023-02-15 DIAGNOSIS — K219 Gastro-esophageal reflux disease without esophagitis: Secondary | ICD-10-CM | POA: Diagnosis not present

## 2023-02-15 DIAGNOSIS — Z0000 Encounter for general adult medical examination without abnormal findings: Secondary | ICD-10-CM | POA: Diagnosis not present

## 2023-02-15 DIAGNOSIS — E559 Vitamin D deficiency, unspecified: Secondary | ICD-10-CM | POA: Diagnosis not present

## 2023-02-15 DIAGNOSIS — R221 Localized swelling, mass and lump, neck: Secondary | ICD-10-CM | POA: Diagnosis not present

## 2023-02-15 DIAGNOSIS — R232 Flushing: Secondary | ICD-10-CM | POA: Diagnosis not present

## 2023-02-15 DIAGNOSIS — M545 Low back pain, unspecified: Secondary | ICD-10-CM | POA: Diagnosis not present

## 2023-02-15 DIAGNOSIS — M85852 Other specified disorders of bone density and structure, left thigh: Secondary | ICD-10-CM | POA: Diagnosis not present

## 2023-03-02 DIAGNOSIS — N958 Other specified menopausal and perimenopausal disorders: Secondary | ICD-10-CM | POA: Diagnosis not present

## 2023-03-02 DIAGNOSIS — M545 Low back pain, unspecified: Secondary | ICD-10-CM | POA: Diagnosis not present

## 2023-03-02 DIAGNOSIS — M8588 Other specified disorders of bone density and structure, other site: Secondary | ICD-10-CM | POA: Diagnosis not present

## 2023-03-02 DIAGNOSIS — M25572 Pain in left ankle and joints of left foot: Secondary | ICD-10-CM | POA: Diagnosis not present

## 2023-03-10 DIAGNOSIS — R52 Pain, unspecified: Secondary | ICD-10-CM | POA: Diagnosis not present

## 2023-03-10 DIAGNOSIS — M810 Age-related osteoporosis without current pathological fracture: Secondary | ICD-10-CM | POA: Diagnosis not present

## 2023-03-10 DIAGNOSIS — M6530 Trigger finger, unspecified finger: Secondary | ICD-10-CM | POA: Diagnosis not present

## 2023-03-10 DIAGNOSIS — Z8781 Personal history of (healed) traumatic fracture: Secondary | ICD-10-CM | POA: Diagnosis not present

## 2023-03-16 DIAGNOSIS — M84372D Stress fracture, left ankle, subsequent encounter for fracture with routine healing: Secondary | ICD-10-CM | POA: Diagnosis not present

## 2023-04-01 ENCOUNTER — Encounter: Payer: Self-pay | Admitting: Gastroenterology

## 2023-04-07 DIAGNOSIS — M85852 Other specified disorders of bone density and structure, left thigh: Secondary | ICD-10-CM | POA: Diagnosis not present

## 2023-04-13 DIAGNOSIS — M25572 Pain in left ankle and joints of left foot: Secondary | ICD-10-CM | POA: Diagnosis not present

## 2023-05-05 ENCOUNTER — Ambulatory Visit: Payer: Medicare HMO

## 2023-05-05 VITALS — Ht 63.0 in | Wt 160.0 lb

## 2023-05-05 DIAGNOSIS — Z1211 Encounter for screening for malignant neoplasm of colon: Secondary | ICD-10-CM

## 2023-05-05 MED ORDER — NA SULFATE-K SULFATE-MG SULF 17.5-3.13-1.6 GM/177ML PO SOLN: 1.0000 | Freq: Once | ORAL | 0 refills | Status: AC

## 2023-05-05 NOTE — Progress Notes (Signed)
Pre visit completed via phone call; Patient verified name, DOB, and address; No egg or soy allergy known to patient; No issues known to pt with past sedation with any surgeries or procedures; Patient denies ever being told they had issues or difficulty with intubation;  No FH of Malignant Hyperthermia; Pt is not on diet pills; Pt is not on home 02;  Pt is not on blood thinners;  Pt denies issues with constipation;  No A fib or A flutter; Have any cardiac testing pending--NO Insurance verified during PV appt--- [employer] Medicare Pt can ambulate without assistance;  Pt denies use of chewing tobacco; Discussed diabetic/weight loss medication holds; Discussed NSAID holds; Checked BMI to be less than 50; Pt instructed to use [employer] or GoodRx for a price reduction on prep;  Patient's chart reviewed by Cathlyn Parsons CNRA prior to previsit and patient appropriate for the [facility];  Pre visit completed and red dot placed by patient's name on their procedure day (on provider's schedule);    Instructions printed and mailed to the patient per her request;

## 2023-06-01 ENCOUNTER — Encounter: Payer: Medicare HMO | Admitting: Gastroenterology

## 2023-07-25 DIAGNOSIS — G5603 Carpal tunnel syndrome, bilateral upper limbs: Secondary | ICD-10-CM | POA: Diagnosis not present

## 2023-07-25 DIAGNOSIS — M65332 Trigger finger, left middle finger: Secondary | ICD-10-CM | POA: Diagnosis not present

## 2023-07-25 DIAGNOSIS — M65331 Trigger finger, right middle finger: Secondary | ICD-10-CM | POA: Diagnosis not present

## 2023-10-10 DIAGNOSIS — M85852 Other specified disorders of bone density and structure, left thigh: Secondary | ICD-10-CM | POA: Diagnosis not present

## 2023-10-19 DIAGNOSIS — Z1231 Encounter for screening mammogram for malignant neoplasm of breast: Secondary | ICD-10-CM | POA: Diagnosis not present

## 2024-02-28 DIAGNOSIS — R030 Elevated blood-pressure reading, without diagnosis of hypertension: Secondary | ICD-10-CM | POA: Diagnosis not present

## 2024-02-28 DIAGNOSIS — B356 Tinea cruris: Secondary | ICD-10-CM | POA: Diagnosis not present

## 2024-02-28 DIAGNOSIS — K30 Functional dyspepsia: Secondary | ICD-10-CM | POA: Diagnosis not present

## 2024-04-02 DIAGNOSIS — H5212 Myopia, left eye: Secondary | ICD-10-CM | POA: Diagnosis not present

## 2024-04-03 DIAGNOSIS — Z0100 Encounter for examination of eyes and vision without abnormal findings: Secondary | ICD-10-CM | POA: Diagnosis not present

## 2024-04-18 DIAGNOSIS — J309 Allergic rhinitis, unspecified: Secondary | ICD-10-CM | POA: Diagnosis not present

## 2024-04-18 DIAGNOSIS — M85852 Other specified disorders of bone density and structure, left thigh: Secondary | ICD-10-CM | POA: Diagnosis not present

## 2024-04-18 DIAGNOSIS — H9319 Tinnitus, unspecified ear: Secondary | ICD-10-CM | POA: Diagnosis not present

## 2024-04-18 DIAGNOSIS — Z1211 Encounter for screening for malignant neoplasm of colon: Secondary | ICD-10-CM | POA: Diagnosis not present

## 2024-04-18 DIAGNOSIS — H6121 Impacted cerumen, right ear: Secondary | ICD-10-CM | POA: Diagnosis not present

## 2024-05-07 DIAGNOSIS — H6121 Impacted cerumen, right ear: Secondary | ICD-10-CM | POA: Diagnosis not present

## 2024-05-07 DIAGNOSIS — J019 Acute sinusitis, unspecified: Secondary | ICD-10-CM | POA: Diagnosis not present

## 2024-05-07 DIAGNOSIS — E559 Vitamin D deficiency, unspecified: Secondary | ICD-10-CM | POA: Diagnosis not present
